# Patient Record
Sex: Female | Born: 1990 | Race: Black or African American | Hispanic: No | Marital: Single | Smoking: Current some day smoker
Health system: Southern US, Community
[De-identification: ages and names within clinical notes are randomized; demographics above are authoritative.]

## PROBLEM LIST (undated history)

## (undated) DIAGNOSIS — D649 Anemia, unspecified: Secondary | ICD-10-CM

## (undated) DIAGNOSIS — I456 Pre-excitation syndrome: Secondary | ICD-10-CM

## (undated) DIAGNOSIS — N289 Disorder of kidney and ureter, unspecified: Secondary | ICD-10-CM

## (undated) DIAGNOSIS — F1911 Other psychoactive substance abuse, in remission: Secondary | ICD-10-CM

## (undated) DIAGNOSIS — G40909 Epilepsy, unspecified, not intractable, without status epilepticus: Secondary | ICD-10-CM

## (undated) DIAGNOSIS — Z62810 Personal history of physical and sexual abuse in childhood: Secondary | ICD-10-CM

## (undated) HISTORY — PX: KIDNEY STONE SURGERY: SHX686

## (undated) HISTORY — DX: Anemia, unspecified: D64.9

## (undated) HISTORY — PX: APPENDECTOMY: SHX54

---

## 2016-06-21 DIAGNOSIS — I82409 Acute embolism and thrombosis of unspecified deep veins of unspecified lower extremity: Secondary | ICD-10-CM

## 2016-06-21 HISTORY — DX: Acute embolism and thrombosis of unspecified deep veins of unspecified lower extremity: I82.409

## 2018-06-21 DIAGNOSIS — N200 Calculus of kidney: Secondary | ICD-10-CM

## 2018-06-21 HISTORY — DX: Calculus of kidney: N20.0

## 2019-04-21 ENCOUNTER — Encounter (HOSPITAL_COMMUNITY): Payer: Self-pay | Admitting: Emergency Medicine

## 2019-04-21 ENCOUNTER — Other Ambulatory Visit: Payer: Self-pay

## 2019-04-21 ENCOUNTER — Emergency Department (HOSPITAL_COMMUNITY)
Admission: EM | Admit: 2019-04-21 | Discharge: 2019-04-21 | Disposition: A | Discharge status: Home / Self Care | Payer: Medicaid - Out of State | Attending: Emergency Medicine | Admitting: Emergency Medicine

## 2019-04-21 DIAGNOSIS — F17210 Nicotine dependence, cigarettes, uncomplicated: Secondary | ICD-10-CM | POA: Diagnosis not present

## 2019-04-21 DIAGNOSIS — M62838 Other muscle spasm: Secondary | ICD-10-CM | POA: Insufficient documentation

## 2019-04-21 DIAGNOSIS — M25512 Pain in left shoulder: Secondary | ICD-10-CM | POA: Diagnosis present

## 2019-04-21 HISTORY — DX: Pre-excitation syndrome: I45.6

## 2019-04-21 HISTORY — DX: Epilepsy, unspecified, not intractable, without status epilepticus: G40.909

## 2019-04-21 MED ORDER — METHOCARBAMOL 500 MG PO TABS: 500.0000 mg | ORAL_TABLET | Freq: Two times a day (BID) | ORAL | 0 refills | Status: DC | PRN

## 2019-04-21 MED ORDER — KETOROLAC TROMETHAMINE 60 MG/2ML IM SOLN
60.0000 mg | Freq: Once | INTRAMUSCULAR | Status: AC
  Administered 2019-04-21: 11:00:00 60 mg via INTRAMUSCULAR
  Filled 2019-04-21: qty 2

## 2019-04-21 MED ORDER — PREDNISONE 20 MG PO TABS: 40.0000 mg | ORAL_TABLET | Freq: Every day | ORAL | 0 refills | Status: DC

## 2019-04-21 MED ORDER — MELOXICAM 7.5 MG PO TABS: 7.5000 mg | ORAL_TABLET | Freq: Two times a day (BID) | ORAL | 0 refills | Status: AC | PRN

## 2019-04-21 MED ORDER — DEXAMETHASONE SODIUM PHOSPHATE 10 MG/ML IJ SOLN
10.0000 mg | Freq: Once | INTRAMUSCULAR | Status: AC
  Administered 2019-04-21: 11:00:00 10 mg via INTRAMUSCULAR
  Filled 2019-04-21: qty 1

## 2019-04-21 NOTE — ED Provider Notes (Signed)
[facility] EMERGENCY DEPARTMENT Provider Note   CSN: [id number] Arrival date & time: 04/21/19  9767     History   Chief Complaint Chief Complaint  Patient presents with  . Shoulder Pain  . Arm Pain    HPI Gina Alvarado is a 28 y.o. female.     HPI  This patient is a 28 year old female, known history of epilepsy and Wolff-Parkinson-White syndrome, she has a history of several years worth of pain that seems to come and go around the left shoulder.  This started 3 days ago, she was normal 4 days ago but 3 days ago started to have pain in the left shoulder, this is more in the trapezius, there is swelling associated with it, she is unable to move the left shoulder though she has no trouble moving her arm below the elbow.  No numbness, no weakness, no injuries.  This is the same thing that happens intermittently over the last several years.  She reports having imaging of the same injury in the past but has not had any focal answers.  Past Medical History:  Diagnosis Date  . Epilepsy ([place])   . WPW (Wolff-Parkinson-White syndrome)     There are no active problems to display for this patient.   Past Surgical History:  Procedure Laterality Date  . APPENDECTOMY    . CESAREAN SECTION    . KIDNEY STONE SURGERY       OB History   No obstetric history on file.      Home Medications    Prior to Admission medications   Medication Sig Start Date End Date Taking? Authorizing Provider  meloxicam (MOBIC) 7.5 MG tablet Take 1 tablet (7.5 mg total) by mouth 2 (two) times daily as needed for up to 14 days for pain. 04/21/19 05/05/19  Noemi Chapel, MD  methocarbamol (ROBAXIN) 500 MG tablet Take 1 tablet (500 mg total) by mouth 2 (two) times daily as needed for muscle spasms. 04/21/19   Noemi Chapel, MD  predniSONE (DELTASONE) 20 MG tablet Take 2 tablets (40 mg total) by mouth daily. 04/21/19   Noemi Chapel, MD    Family History No family history on file.  Social History Social History   Tobacco Use  . Smoking status: Current Every Day Smoker  . Smokeless tobacco: Never Used  Substance Use Topics  . Alcohol use: Yes  . Drug use: Never     Allergies   Patient has no allergy information on record.   Review of Systems Review of Systems  Musculoskeletal: Positive for myalgias, neck pain and neck stiffness. Negative for gait problem and joint swelling.  Neurological: Negative for weakness and numbness.  Hematological: Negative for adenopathy.     Physical Exam Updated Vital Signs BP (!) 141/104 (BP Location: Right Arm)   Pulse (!) 106   Temp 98.5 F (36.9 C) (Oral)   Resp 18   LMP 04/07/2019   SpO2 95%   Physical Exam Vitals signs and nursing note reviewed.  Constitutional:      Appearance: She is well-developed. She is not diaphoretic.  HENT:     Head: Normocephalic and atraumatic.  Eyes:     General:        Right eye: No discharge.        Left eye: No discharge.     Conjunctiva/sclera: Conjunctivae normal.  Pulmonary:     Effort: Pulmonary effort is normal. No respiratory distress.  Musculoskeletal:        General:  Swelling, tenderness and deformity present.     Comments: The patient has obvious swelling of the trapezius on the left, there is tenderness in this muscle, tenderness with range of motion of the neck and range of motion of the shoulder, no difficulty with ranging the elbow wrist or hand, no numbness or weakness of the arm  Skin:    General: Skin is warm and dry.     Findings: No erythema or rash.  Neurological:     Mental Status: She is alert.     Coordination: Coordination normal.     Comments: As above, no numbness or weakness      ED Treatments / Results  Labs (all labs ordered are listed, but only abnormal results are displayed) Labs Reviewed - No data to display  EKG None  Radiology No results found.  Procedures Procedures (including critical care time)  Medications Ordered in ED  Medications  ketorolac (TORADOL) injection 60 mg (has no administration in time range)  dexamethasone (DECADRON) injection 10 mg (has no administration in time range)     Initial Impression / Assessment and Plan / ED Course  I have reviewed the triage vital signs and the nursing notes.  Pertinent labs & imaging results that were available during my care of the patient were reviewed by me and considered in my medical decision making (see chart for details).        Imaging likely to be unhelpful as this patient has clear muscle spasm of the trapezius muscle.  She will be given Robaxin, anti-inflammatories and steroids, she can follow-up with the orthopedist outpatient, the patient is agreeable to the plan.  Final Clinical Impressions(s) / ED Diagnoses   Final diagnoses:  Muscle spasm of left shoulder area    ED Discharge Orders         Ordered    meloxicam (MOBIC) 7.5 MG tablet  2 times daily PRN     04/21/19 1036    predniSONE (DELTASONE) 20 MG tablet  Daily     04/21/19 1036    methocarbamol (ROBAXIN) 500 MG tablet  2 times daily PRN     04/21/19 1036           Eber Hong, MD 04/21/19 1040

## 2019-04-21 NOTE — Discharge Instructions (Addendum)
Please take the following medications exactly as prescribed  Mobic, 1 tablet twice a day as needed Prednisone, 40 mg daily for 5 days Robaxin, 500 mg twice daily as needed  Call the orthopedist clinic for follow-up, you have significant and severe muscle spasm, you would likely benefit from some element of ongoing physical therapy, warm compresses.

## 2019-04-21 NOTE — ED Triage Notes (Signed)
C/o L shoulder pain that radiates to L elbow x 3 days.  Reports history of same since 2016 with negative x-rays.  No known injury.

## 2019-07-30 ENCOUNTER — Other Ambulatory Visit: Payer: Self-pay

## 2019-07-30 ENCOUNTER — Emergency Department (HOSPITAL_COMMUNITY)
Admission: EM | Admit: 2019-07-30 | Discharge: 2019-07-30 | Disposition: A | Discharge status: Home / Self Care | Payer: Medicaid - Out of State | Attending: Emergency Medicine | Admitting: Emergency Medicine

## 2019-07-30 ENCOUNTER — Encounter (HOSPITAL_COMMUNITY): Payer: Self-pay

## 2019-07-30 DIAGNOSIS — H9201 Otalgia, right ear: Secondary | ICD-10-CM | POA: Insufficient documentation

## 2019-07-30 DIAGNOSIS — R509 Fever, unspecified: Secondary | ICD-10-CM | POA: Insufficient documentation

## 2019-07-30 DIAGNOSIS — J029 Acute pharyngitis, unspecified: Secondary | ICD-10-CM | POA: Insufficient documentation

## 2019-07-30 DIAGNOSIS — Z5321 Procedure and treatment not carried out due to patient leaving prior to being seen by health care provider: Secondary | ICD-10-CM | POA: Insufficient documentation

## 2019-07-30 HISTORY — DX: Disorder of kidney and ureter, unspecified: N28.9

## 2019-07-30 NOTE — ED Triage Notes (Signed)
Patient c/o right ear pain, sore throat, and fever x 3 days.

## 2019-08-01 ENCOUNTER — Emergency Department (HOSPITAL_COMMUNITY)
Admission: EM | Admit: 2019-08-01 | Discharge: 2019-08-01 | Disposition: A | Discharge status: Home / Self Care | Payer: Medicaid - Out of State | Attending: Emergency Medicine | Admitting: Emergency Medicine

## 2019-08-01 ENCOUNTER — Other Ambulatory Visit: Payer: Self-pay

## 2019-08-01 ENCOUNTER — Encounter (HOSPITAL_COMMUNITY): Payer: Self-pay | Admitting: *Deleted

## 2019-08-01 DIAGNOSIS — Z87891 Personal history of nicotine dependence: Secondary | ICD-10-CM | POA: Insufficient documentation

## 2019-08-01 DIAGNOSIS — J069 Acute upper respiratory infection, unspecified: Secondary | ICD-10-CM | POA: Insufficient documentation

## 2019-08-01 DIAGNOSIS — J029 Acute pharyngitis, unspecified: Secondary | ICD-10-CM | POA: Diagnosis present

## 2019-08-01 DIAGNOSIS — H9201 Otalgia, right ear: Secondary | ICD-10-CM | POA: Insufficient documentation

## 2019-08-01 DIAGNOSIS — Z20822 Contact with and (suspected) exposure to covid-19: Secondary | ICD-10-CM | POA: Diagnosis not present

## 2019-08-01 LAB — GROUP A STREP BY PCR: Group A Strep by PCR: NOT DETECTED

## 2019-08-01 LAB — SARS CORONAVIRUS 2 (TAT 6-24 HRS): SARS Coronavirus 2: NEGATIVE

## 2019-08-01 MED ORDER — CHLORHEXIDINE GLUCONATE 0.12 % MT SOLN: 15.0000 mL | Freq: Two times a day (BID) | OROMUCOSAL | 0 refills | Status: DC

## 2019-08-01 MED ORDER — ACETAMINOPHEN 500 MG PO TABS: 500.0000 mg | ORAL_TABLET | Freq: Four times a day (QID) | ORAL | 0 refills | Status: AC | PRN

## 2019-08-01 NOTE — ED Provider Notes (Signed)
[facility]-EMERGENCY DEPT Provider Note   CSN: [id number] Arrival date & time: 08/01/19  3474     History Chief Complaint  Patient presents with  . Sore Throat  . Otalgia    Rt    Gina Alvarado is a 29 y.o. female.  The history is provided by the patient. No language interpreter was used.  Sore Throat  Otalgia    29 year old female G4 P2 who is [redacted] weeks pregnant presenting with cold symptoms.  Patient report for the past 5 days she has had ., sore throat affecting the right side of her throat, mild congestion.  She denies any fever chills no loss of taste or smell no chest pain shortness of breath productive cough no significant neck pain.  She report history of recurrent strep infection in the past and this feels similar.  She denies any recent sick contact.  She is taking over-the-counter including Tylenol and ibuprofen with some relief.  No nausea vomiting or diarrhea. No recent sick contact with anyone with COVID-19.    Past Medical History:  Diagnosis Date  . Epilepsy ([facility])   . Renal disorder   . WPW (Wolff-Parkinson-White syndrome)     There are no problems to display for this patient.   Past Surgical History:  Procedure Laterality Date  . APPENDECTOMY    . CESAREAN SECTION    . KIDNEY STONE SURGERY       OB History    Gravida  1   Para      Term      Preterm      AB      Living        SAB      TAB      Ectopic      Multiple      Live Births              Family History  Problem Relation Age of Onset  . Diabetes Mother   . Hypertension Mother   . Asthma Mother   . Sarcoidosis Father     Social History   Tobacco Use  . Smoking status: Former Games developer  . Smokeless tobacco: Never Used  Substance Use Topics  . Alcohol use: Not Currently  . Drug use: Never    Home Medications Prior to Admission medications   Medication Sig Start Date End Date Taking? Authorizing Provider  methocarbamol (ROBAXIN) 500  MG tablet Take 1 tablet (500 mg total) by mouth 2 (two) times daily as needed for muscle spasms. 04/21/19   Eber Hong, MD  predniSONE (DELTASONE) 20 MG tablet Take 2 tablets (40 mg total) by mouth daily. 04/21/19   Eber Hong, MD    Allergies    Other  Review of Systems   Review of Systems  HENT: Positive for ear pain.   All other systems reviewed and are negative.   Physical Exam Updated Vital Signs BP (!) 142/95 (BP Location: Left Arm)   Pulse 99   Temp 98.1 F (36.7 C) (Oral)   Resp 17   Wt 100.2 kg   LMP 06/16/2019   SpO2 97%   BMI 43.12 kg/m   Physical Exam Vitals and nursing note reviewed.  Constitutional:      General: She is not in acute distress.    Appearance: She is well-developed.  HENT:     Head: Atraumatic.     Comments: TMs erythematous bilat.  R tragus tenderness.  bilateraly hypertrophy tonsils.  Some erythema noted to right tonsillar pillar.  Uvula midline.  No trismus.    Right Ear: Tympanic membrane is erythematous.     Left Ear: Tympanic membrane is erythematous.     Mouth/Throat:     Pharynx: Uvula midline. Posterior oropharyngeal erythema present.     Tonsils: 2+ on the right. 2+ on the left.  Eyes:     Conjunctiva/sclera: Conjunctivae normal.  Cardiovascular:     Rate and Rhythm: Normal rate and regular rhythm.     Heart sounds: Normal heart sounds.  Pulmonary:     Effort: Pulmonary effort is normal.     Breath sounds: Normal breath sounds. No wheezing, rhonchi or rales.  Abdominal:     Palpations: Abdomen is soft.  Musculoskeletal:     Cervical back: Neck supple.  Skin:    Findings: No rash.  Neurological:     Mental Status: She is alert and oriented to person, place, and time.  Psychiatric:        Mood and Affect: Mood normal.     ED Results / Procedures / Treatments   Labs (all labs ordered are listed, but only abnormal results are displayed) Labs Reviewed  GROUP A STREP BY PCR  SARS CORONAVIRUS 2 (TAT 6-24 HRS)     EKG None  Radiology No results found.  Procedures Procedures (including critical care time)  Medications Ordered in ED Medications - No data to display  ED Course  I have reviewed the triage vital signs and the nursing notes.  Pertinent labs & imaging results that were available during my care of the patient were reviewed by me and considered in my medical decision making (see chart for details).    MDM Rules/Calculators/A&P                      BP (!) 142/95 (BP Location: Left Arm)   Pulse 99   Temp 98.1 F (36.7 C) (Oral)   Resp 17   Wt 100.2 kg   LMP 06/16/2019   SpO2 97%   BMI 43.12 kg/m   Final Clinical Impression(s) / ED Diagnoses Final diagnoses:  Viral upper respiratory tract infection    Rx / DC Orders ED Discharge Orders         Ordered    acetaminophen (TYLENOL) 500 MG tablet  Every 6 hours PRN     08/01/19 1042    chlorhexidine (PERIDEX) 0.12 % solution  2 times daily     08/01/19 1042         9:03 AM Patient here with sore throat and URI symptoms.  No obvious signs to suggest peritonsillar abscess or deep tissue infection.  Patient is well-appearing.  Strep test ordered.  She is currently [redacted] weeks pregnant.  I recommend patient to avoid NSAIDs use during pregnancy.  10:40 AM Strep test negative, COVID-19 test currently pending.  Pt d/c home with tylenol and peridex for sxs control.  Return precaution given.  Recommend outpt f/u with OB for further management of her pregnancy.   Gina Alvarado was evaluated in Emergency Department on 08/01/2019 for the symptoms described in the history of present illness. She was evaluated in the context of the global COVID-19 pandemic, which necessitated consideration that the patient might be at risk for infection with the SARS-CoV-2 virus that causes COVID-19. Institutional protocols and algorithms that pertain to the evaluation of patients at risk for COVID-19 are in a state of rapid change based on  information released  by regulatory bodies including the CDC and federal and state organizations. These policies and algorithms were followed during the patient's care in the ED.    Gina Moras, PA-C 08/01/19 1044    Gina Dessert, MD 08/03/19 1704

## 2019-08-01 NOTE — ED Triage Notes (Signed)
C/o sore throat and rt ear pain, hoariness noted today.

## 2019-08-01 NOTE — Discharge Instructions (Signed)
Your symptoms are likely due to a viral upper respiratory infection. A covid-19 test have been obtained, please check the result in the next 24 hrs in your MyChart.  Avoid non-steroidal anti-inflammatory medication during pregnancy as it can be harmful.  Return if your symptoms worsen.

## 2019-09-25 ENCOUNTER — Other Ambulatory Visit: Payer: Self-pay

## 2019-09-25 ENCOUNTER — Encounter (HOSPITAL_COMMUNITY): Payer: Self-pay | Admitting: Emergency Medicine

## 2019-09-25 ENCOUNTER — Emergency Department (HOSPITAL_COMMUNITY): Payer: Medicaid - Out of State

## 2019-09-25 ENCOUNTER — Emergency Department (HOSPITAL_COMMUNITY)
Admission: EM | Admit: 2019-09-25 | Discharge: 2019-09-25 | Disposition: A | Discharge status: Home / Self Care | Payer: Medicaid - Out of State | Attending: Emergency Medicine | Admitting: Emergency Medicine

## 2019-09-25 DIAGNOSIS — O219 Vomiting of pregnancy, unspecified: Secondary | ICD-10-CM | POA: Insufficient documentation

## 2019-09-25 DIAGNOSIS — N3091 Cystitis, unspecified with hematuria: Secondary | ICD-10-CM | POA: Insufficient documentation

## 2019-09-25 DIAGNOSIS — O99891 Other specified diseases and conditions complicating pregnancy: Secondary | ICD-10-CM | POA: Diagnosis present

## 2019-09-25 DIAGNOSIS — Z3A14 14 weeks gestation of pregnancy: Secondary | ICD-10-CM | POA: Insufficient documentation

## 2019-09-25 DIAGNOSIS — N390 Urinary tract infection, site not specified: Secondary | ICD-10-CM

## 2019-09-25 DIAGNOSIS — O2342 Unspecified infection of urinary tract in pregnancy, second trimester: Secondary | ICD-10-CM | POA: Insufficient documentation

## 2019-09-25 DIAGNOSIS — Z3491 Encounter for supervision of normal pregnancy, unspecified, first trimester: Secondary | ICD-10-CM

## 2019-09-25 DIAGNOSIS — R109 Unspecified abdominal pain: Secondary | ICD-10-CM

## 2019-09-25 DIAGNOSIS — R112 Nausea with vomiting, unspecified: Secondary | ICD-10-CM

## 2019-09-25 LAB — COMPREHENSIVE METABOLIC PANEL
ALT: 14 U/L (ref 0–44)
AST: 20 U/L (ref 15–41)
Albumin: 3.8 g/dL (ref 3.5–5.0)
Alkaline Phosphatase: 51 U/L (ref 38–126)
Anion gap: 8 (ref 5–15)
BUN: 7 mg/dL (ref 6–20)
CO2: 24 mmol/L (ref 22–32)
Calcium: 9.3 mg/dL (ref 8.9–10.3)
Chloride: 107 mmol/L (ref 98–111)
Creatinine, Ser: 0.69 mg/dL (ref 0.44–1.00)
GFR calc Af Amer: >60 mL/min (ref >60)
GFR calc non Af Amer: >60 mL/min (ref >60)
Glucose, Bld: 93 mg/dL (ref 70–99)
Potassium: 4 mmol/L (ref 3.5–5.1)
Sodium: 139 mmol/L (ref 135–145)
Total Bilirubin: 0.4 mg/dL (ref 0.3–1.2)
Total Protein: 7.6 g/dL (ref 6.5–8.1)

## 2019-09-25 LAB — URINALYSIS, ROUTINE W REFLEX MICROSCOPIC
Bilirubin Urine: NEGATIVE
Glucose, UA: NEGATIVE mg/dL
Hgb urine dipstick: NEGATIVE
Ketones, ur: 20 mg/dL — AB
Nitrite: POSITIVE — AB
Protein, ur: 100 mg/dL — AB
Specific Gravity, Urine: 1.028 (ref 1.005–1.030)
pH: 7 (ref 5.0–8.0)

## 2019-09-25 LAB — CBC
HCT: 37.7 % (ref 36.0–46.0)
Hemoglobin: 12.3 g/dL (ref 12.0–15.0)
MCH: 30.8 pg (ref 26.0–34.0)
MCHC: 32.6 g/dL (ref 30.0–36.0)
MCV: 94.3 fL (ref 80.0–100.0)
Platelets: 342 10*3/uL (ref 150–400)
RBC: 4 MIL/uL (ref 3.87–5.11)
RDW: 13.4 % (ref 11.5–15.5)
WBC: 12.6 10*3/uL — ABNORMAL HIGH (ref 4.0–10.5)
nRBC: 0 % (ref 0.0–0.2)

## 2019-09-25 LAB — LIPASE, BLOOD: Lipase: 32 U/L (ref 11–51)

## 2019-09-25 LAB — I-STAT BETA HCG BLOOD, ED (MC, WL, AP ONLY): I-stat hCG, quantitative: >2000 m[IU]/mL — ABNORMAL HIGH (ref <5)

## 2019-09-25 MED ORDER — SODIUM CHLORIDE 0.9 % IV SOLN
2.0000 g | Freq: Once | INTRAVENOUS | Status: AC
  Administered 2019-09-25: 2 g via INTRAVENOUS
  Filled 2019-09-25: qty 20

## 2019-09-25 MED ORDER — METOCLOPRAMIDE HCL 10 MG PO TABS: 10.0000 mg | ORAL_TABLET | Freq: Three times a day (TID) | ORAL | 0 refills | Status: DC | PRN

## 2019-09-25 MED ORDER — SODIUM CHLORIDE 0.9 % IV BOLUS
1000.0000 mL | Freq: Once | INTRAVENOUS | Status: AC
  Administered 2019-09-25: 1000 mL via INTRAVENOUS

## 2019-09-25 MED ORDER — METOCLOPRAMIDE HCL 5 MG/ML IJ SOLN
10.0000 mg | Freq: Once | INTRAMUSCULAR | Status: AC
  Administered 2019-09-25: 10 mg via INTRAVENOUS
  Filled 2019-09-25: qty 2

## 2019-09-25 MED ORDER — CEFPODOXIME PROXETIL 200 MG PO TABS: 200.0000 mg | ORAL_TABLET | Freq: Two times a day (BID) | ORAL | 0 refills | Status: DC

## 2019-09-25 MED ORDER — DOXYLAMINE-PYRIDOXINE 10-10 MG PO TBEC: 1.0000 | DELAYED_RELEASE_TABLET | Freq: Every evening | ORAL | 0 refills | Status: DC | PRN

## 2019-09-25 NOTE — ED Notes (Signed)
Patient given water per request.

## 2019-09-25 NOTE — ED Provider Notes (Signed)
[facility]-EMERGENCY DEPT Provider Note   CSN: [id number] Arrival date & time: 09/25/19  1356     History Chief Complaint  Patient presents with  . Abdominal Pain    Gina Alvarado is a 29 y.o. female.  She is approximately [redacted] weeks pregnant but has not seen her OB yet.  [facility] see [facility]  She has had some nausea during pregnancy but she is complaining of increased nausea associated with vomiting along with right low back and right lower quadrant abdominal pain.  No fevers or chills no cough no chest pain.  No vaginal bleeding or discharge.  She is a G3, P1.  No urinary symptoms no hematuria.  The history is provided by the patient.  Abdominal Pain Pain location:  RLQ Pain quality: cramping   Pain radiates to:  Back Pain severity:  Moderate Onset quality:  Gradual Duration:  2 days Timing:  Constant Progression:  Unchanged Chronicity:  New Context: retching   Context: not trauma   Relieved by:  Nothing Worsened by:  Nothing Ineffective treatments:  None tried Associated symptoms: nausea and vomiting   Associated symptoms: no chest pain, no constipation, no cough, no diarrhea, no dysuria, no fever, no hematemesis, no hematochezia, no hematuria, no shortness of breath, no sore throat, no vaginal bleeding and no vaginal discharge   Risk factors: pregnancy        Past Medical History:  Diagnosis Date  . Epilepsy ([facility])   . Renal disorder   . WPW (Wolff-Parkinson-White syndrome)     There are no problems to display for this patient.   Past Surgical History:  Procedure Laterality Date  . APPENDECTOMY    . CESAREAN SECTION    . KIDNEY STONE SURGERY       OB History    Gravida  1   Para      Term      Preterm      AB      Living        SAB      TAB      Ectopic      Multiple      Live Births              Family History  Problem Relation Age of Onset  . Diabetes Mother   .  Hypertension Mother   . Asthma Mother   . Sarcoidosis Father     Social History   Tobacco Use  . Smoking status: Former Games developer  . Smokeless tobacco: Never Used  Substance Use Topics  . Alcohol use: Not Currently  . Drug use: Never    Home Medications Prior to Admission medications   Medication Sig Start Date End Date Taking? Authorizing Provider  acetaminophen (TYLENOL) 500 MG tablet Take 1 tablet (500 mg total) by mouth every 6 (six) hours as needed. 08/01/19   Fayrene Helper, PA-C  chlorhexidine (PERIDEX) 0.12 % solution Use as directed 15 mLs in the mouth or throat 2 (two) times daily. 08/01/19   Fayrene Helper, PA-C  methocarbamol (ROBAXIN) 500 MG tablet Take 1 tablet (500 mg total) by mouth 2 (two) times daily as needed for muscle spasms. 04/21/19   Eber Hong, MD  predniSONE (DELTASONE) 20 MG tablet Take 2 tablets (40 mg total) by mouth daily. 04/21/19   Eber Hong, MD    Allergies    Other  Review of Systems   Review of Systems  Constitutional: Negative  for fever.  HENT: Negative for sore throat.   Eyes: Negative for visual disturbance.  Respiratory: Negative for cough and shortness of breath.   Cardiovascular: Negative for chest pain.  Gastrointestinal: Positive for abdominal pain, nausea and vomiting. Negative for constipation, diarrhea, hematemesis and hematochezia.  Genitourinary: Negative for dysuria, hematuria, vaginal bleeding and vaginal discharge.  Musculoskeletal: Positive for back pain.  Skin: Negative for rash.  Neurological: Negative for headaches.    Physical Exam Updated Vital Signs BP 138/87 (BP Location: Right Arm)   Pulse 95   Temp 99.4 F (37.4 C) (Oral)   Resp 16   LMP 06/16/2019   SpO2 97%   Physical Exam Vitals and nursing note reviewed.  Constitutional:      General: She is not in acute distress.    Appearance: She is well-developed.  HENT:     Head: Normocephalic and atraumatic.  Eyes:     Conjunctiva/sclera: Conjunctivae normal.   Cardiovascular:     Rate and Rhythm: Normal rate and regular rhythm.     Heart sounds: No murmur.  Pulmonary:     Effort: Pulmonary effort is normal. No respiratory distress.     Breath sounds: Normal breath sounds.  Abdominal:     Palpations: Abdomen is soft.     Tenderness: There is abdominal tenderness in the right lower quadrant. There is no guarding or rebound.  Musculoskeletal:        General: Normal range of motion.     Cervical back: Neck supple.     Right lower leg: No edema.     Left lower leg: No edema.  Skin:    General: Skin is warm and dry.     Capillary Refill: Capillary refill takes less than 2 seconds.  Neurological:     General: No focal deficit present.     Mental Status: She is alert.     Gait: Gait normal.     ED Results / Procedures / Treatments   Labs (all labs ordered are listed, but only abnormal results are displayed) Labs Reviewed  CBC - Abnormal; Notable for the following components:      Result Value   WBC 12.6 (*)    All other components within normal limits  URINALYSIS, ROUTINE W REFLEX MICROSCOPIC - Abnormal; Notable for the following components:   Color, Urine AMBER (*)    APPearance CLOUDY (*)    Ketones, ur 20 (*)    Protein, ur 100 (*)    Nitrite POSITIVE (*)    Leukocytes,Ua SMALL (*)    Bacteria, UA MANY (*)    All other components within normal limits  I-STAT BETA HCG BLOOD, ED ([facility], [facility], [facility] ONLY) - Abnormal; Notable for the following components:   I-stat hCG, quantitative >2,000.0 (*)    All other components within normal limits  URINE CULTURE  LIPASE, BLOOD  COMPREHENSIVE METABOLIC PANEL    EKG None  Radiology US OB Limited  Result Date: 09/25/2019 CLINICAL DATA:  Abdominal pain EXAM: LIMITED OBSTETRIC ULTRASOUND FINDINGS: Number of Fetuses: 1 Heart Rate:  170 bpm Movement: Visualized Presentation: Transverse Placental Location: Anterior Previa: No Amniotic Fluid (Subjective):  Within normal limits. AFI:  cm BPD: 2.6 cm 14  w  3 d MATERNAL FINDINGS: Cervix:  Appears closed. Uterus/Adnexae: No abnormality visualized. IMPRESSION: Fourteen week 3 day pregnancy. Fetal heart rate 170 beats per minute. No acute maternal findings. This exam is performed on an emergent basis and does not comprehensively evaluate fetal size, dating, or anatomy;  follow-up complete OB US should be considered if further fetal assessment is warranted. Electronically Signed   By: Charlett Nose M.D.   On: 09/25/2019 19:07    Procedures Procedures (including critical care time)  Medications Ordered in ED Medications  sodium chloride 0.9 % bolus 1,000 mL (0 mLs Intravenous Stopped 09/25/19 2011)  metoCLOPramide (REGLAN) injection 10 mg (10 mg Intravenous Given 09/25/19 1746)  cefTRIAXone (ROCEPHIN) 2 g in sodium chloride 0.9 % 100 mL IVPB (0 g Intravenous Stopped 09/25/19 2045)    ED Course  I have reviewed the triage vital signs and the nursing notes.  Pertinent labs & imaging results that were available during my care of the patient were reviewed by me and considered in my medical decision making (see chart for details).  Clinical Course as of Sep 25 1445  Tue Sep 25, 2019  6314 Patient's ultrasound showing an IUP with fetal heart rate 170.  Interpreted by me.  Awaiting final reports.  Patient herself is feeling much better and is given a urine sample.  Waiting on results of urine.  Likely discharge with OB follow-up.   [MB]  2003 Discussed with Dr[facility].  He is recommending giving the patient a dose of IV ceftriaxone and sending her out on Cefpodoxime if she has insurance ordered Keflex and have her follow-up in the clinic.   [MB]    Clinical Course User Index [MB] Terrilee Files, MD   MDM Rules/Calculators/A&P                     This patient complains of abdominal pain nausea vomiting in the setting of pregnancy; this involves an extensive number of treatment Options and is a complaint that carries  with it a high risk of complications and Morbidity. The differential includes pregnancy issue, pyelonephritis, hyperemesis, intra-abdominal pathology.  She is already had an appendix removed so not likely related to that.  I ordered, reviewed and interpreted labs, which included white blood cell count 12.6 elevated.  Normal chemistries normal LFTs.  Urinalysis with 21-50 reds 6-10 whites many bacteria dirty sample 21-50 squames.  Is nitrite positive. I ordered medication IV fluids Reglan and ceftriaxone for treatment of presumptive urinary tract infection. I ordered imaging studies which included ultrasound pelvis and I independently    visualized and interpreted imaging which showed intrauterine pregnancy with fetal heart rate of 170s Previous records obtained and reviewed in epic I consulted Dr. Damaris Schooner and discussed lab and imaging findings.  He recommended starting the patient on IV and then oral antibiotics and have her follow-up with the clinic  After the interventions stated above, I reevaluated the patient and found improvement in her pain and nausea.  This is possibly reflective of a kidney stone lower pain does not seem colicky.  Possibly just from cystitis.  Reassuring fetal ultrasound and improvement in her symptoms and so I think outpatient follow-up is appropriate.  We discussed return instructions.  All questions answered to the best my ability.   Final Clinical Impression(s) / ED Diagnoses Final diagnoses:  First trimester pregnancy  Urinary tract infection with hematuria, site unspecified  Non-intractable vomiting with nausea, unspecified vomiting type    Rx / DC Orders ED Discharge Orders         Ordered    metoCLOPramide (REGLAN) 10 MG tablet  Every 8 hours PRN     09/25/19 2045    Doxylamine-Pyridoxine 10-10 MG TBEC  At  bedtime PRN     09/25/19 2045    cefpodoxime (VANTIN) 200 MG tablet  2 times daily     09/25/19 2045           Terrilee Files, MD 09/26/19  1450

## 2019-09-25 NOTE — Discharge Instructions (Addendum)
You were seen in the emergency department for low back pain nausea vomiting and low abdominal pain.  You had an ultrasound that showed a 12-week and 2-day pregnancy.  Your urine looks like possible infection so we gave you some IV antibiotics and are giving you some oral antibiotics and some nausea medication.  Please call your OB clinic tomorrow to arrange close follow-up.  Return to the emergency department if any worsening symptoms.

## 2019-09-25 NOTE — ED Notes (Signed)
US at bedside

## 2019-09-25 NOTE — ED Triage Notes (Signed)
Per pt, states she started having abdominal cramping and vomiting last night-states she is [redacted] weeks pregnant-has not seen GYN MD

## 2019-09-28 LAB — URINE CULTURE: Culture: >=100000 — AB

## 2019-09-29 ENCOUNTER — Telehealth: Payer: Self-pay | Admitting: Emergency Medicine

## 2019-09-29 NOTE — Telephone Encounter (Signed)
Post ED Visit - Positive Culture Follow-up  Culture report reviewed by antimicrobial stewardship pharmacist: [facility] []  , Pharm.D. []  Enzo Bi, Pharm.D., BCPS AQ-ID []  , Pharm.D., BCPS []  Celedonio Miyamoto, Pharm.D., BCPS []  Parker, Garvin Fila.D., BCPS, AAHIVP []  , Pharm.D., BCPS, AAHIVP []  Georgina Pillion, PharmD, BCPS []  , PharmD, BCPS []  [place], PharmD, BCPS []  [address], PharmD []  , PharmD, BCPS []  Estella Husk, PharmD  Pharmacy Team []  Lysle Pearl, PharmD []  , PharmD []  Phillips Climes, PharmD []  , Rph []  [employer]) , PharmD []  Verlan Friends, PharmD [x]  , PharmD []  Mervyn Gay, PharmD []  , PharmD []  Vinnie Level, PharmD []  [facility], PharmD []  , PharmD []  Len Childs, PharmD   Positive urine culture Treated with Cefpodoxime, organism sensitive to the same and no further patient follow-up is required at this time.  Larayah Clute 09/29/2019, 1:42 PM

## 2019-10-08 ENCOUNTER — Ambulatory Visit (INDEPENDENT_AMBULATORY_CARE_PROVIDER_SITE_OTHER): Payer: Medicaid - Out of State | Admitting: *Deleted

## 2019-10-08 ENCOUNTER — Other Ambulatory Visit: Payer: Self-pay

## 2019-10-08 DIAGNOSIS — G40909 Epilepsy, unspecified, not intractable, without status epilepticus: Secondary | ICD-10-CM | POA: Insufficient documentation

## 2019-10-08 DIAGNOSIS — N390 Urinary tract infection, site not specified: Secondary | ICD-10-CM

## 2019-10-08 DIAGNOSIS — R319 Hematuria, unspecified: Secondary | ICD-10-CM

## 2019-10-08 DIAGNOSIS — O99210 Obesity complicating pregnancy, unspecified trimester: Secondary | ICD-10-CM

## 2019-10-08 DIAGNOSIS — I456 Pre-excitation syndrome: Secondary | ICD-10-CM

## 2019-10-08 DIAGNOSIS — O0990 Supervision of high risk pregnancy, unspecified, unspecified trimester: Secondary | ICD-10-CM

## 2019-10-08 DIAGNOSIS — Z86718 Personal history of other venous thrombosis and embolism: Secondary | ICD-10-CM | POA: Insufficient documentation

## 2019-10-08 HISTORY — DX: Urinary tract infection, site not specified: N39.0

## 2019-10-08 NOTE — Progress Notes (Signed)
I connected with  Gina Alvarado on 10/08/19 at  2:30 PM EDT by telephone and verified that I am speaking with the correct person using two identifiers.   I discussed the limitations, risks, security and privacy concerns of performing an evaluation and management service by telephone and the availability of in person appointments. I also discussed with the patient that there may be a patient responsible charge related to this service. The patient expressed understanding and agreed to proceed.  I explained I am completing her New OB Intake today. We discussed Her EDD and that it is based on  sure LMP . I reviewed her allergies, meds, OB History, Medical /Surgical history, and appropriate screenings. I informed her of [facility] services. She does have a  History of DVT. I disucssed with Dr.Stinson and informed Gina Alvarado we will discuss with her prescribing Lovenox for during her pregnancy due to her history of DVT but since we have not seen her yet - doctor will need to see her and discuss; then prescribe lovenox. I also asked if she has a cardiologist. She states she last saw in about 2018 and hasn't gotten once since she moved to [facility]. I explained we will probably send her to a cardiologist due to her history of Evelene Croon parkison white syndrome.  I explained I will send her the [employer] app and app was sent to her while on phone.  I explained we will send a blood pressure cuff to [facility] once her [place] Medicaid is approved. She states she has already applied for [place] Medicaid.  Explained  then we will have her take her blood pressure weekly and enter into the app. I explained she will have some visits in office and some virtually. She already has MyChart and I assisted her with downloading the  app. I reviewed her new ob  appointment date/ time with her , our location and to wear mask, no visitors.  I explained she will have a pelvic exam, ob bloodwork, hemoglobin a1C, cbg ,pap, and  genetic testing if desired,-  she does want a panorama. I scheduled an [place] at 19 weeks and gave her the appointment. She voices understanding.   Gina Podgorski,RN  10/08/2019  2:27 PM

## 2019-10-08 NOTE — Progress Notes (Signed)
Patient ID: Gina Alvarado, female   DOB: 12/14/1990, 29 y.o.   MRN: [id number] Patient seen and assessed by nursing staff during this encounter. I have reviewed the chart and agree with the documentation and plan. I have also made any necessary editorial changes.  Scheryl Darter, MD 10/08/2019 3:28 PM

## 2019-10-08 NOTE — Patient Instructions (Signed)

## 2019-10-11 ENCOUNTER — Ambulatory Visit (INDEPENDENT_AMBULATORY_CARE_PROVIDER_SITE_OTHER): Payer: Medicaid - Out of State | Admitting: Obstetrics & Gynecology

## 2019-10-11 ENCOUNTER — Encounter: Payer: Self-pay | Admitting: Obstetrics & Gynecology

## 2019-10-11 ENCOUNTER — Other Ambulatory Visit (HOSPITAL_COMMUNITY)
Admission: RE | Admit: 2019-10-11 | Discharge: 2019-10-11 | Disposition: A | Discharge status: Home / Self Care | Payer: Medicaid - Out of State | Source: Ambulatory Visit | Attending: Obstetrics and Gynecology | Admitting: Obstetrics and Gynecology

## 2019-10-11 ENCOUNTER — Other Ambulatory Visit: Payer: Self-pay

## 2019-10-11 VITALS — BP 119/82 | HR 97 | Wt 222.1 lb

## 2019-10-11 DIAGNOSIS — O0990 Supervision of high risk pregnancy, unspecified, unspecified trimester: Secondary | ICD-10-CM | POA: Insufficient documentation

## 2019-10-11 DIAGNOSIS — I456 Pre-excitation syndrome: Secondary | ICD-10-CM

## 2019-10-11 DIAGNOSIS — G40909 Epilepsy, unspecified, not intractable, without status epilepticus: Secondary | ICD-10-CM

## 2019-10-11 DIAGNOSIS — Z86718 Personal history of other venous thrombosis and embolism: Secondary | ICD-10-CM

## 2019-10-11 MED ORDER — ENOXAPARIN SODIUM 40 MG/0.4ML ~~LOC~~ SOLN: 40.0000 mg | SUBCUTANEOUS | 6 refills | Status: AC

## 2019-10-11 NOTE — Progress Notes (Signed)
  Subjective:h/o DVT    Gina Alvarado is a G3P1011 [redacted]w[redacted]d being seen today for her first obstetrical visit.  Her obstetrical history is significant for H/O DVT, CS, seizure, WPW. Patient does intend to breast feed. Pregnancy history fully reviewed.  Patient reports nausea.  Vitals:   10/11/19 1528  BP: 119/82  Pulse: 97  Weight: 222 lb 1.6 oz (100.7 kg)    HISTORY: OB History  Gravida Para Term Preterm AB Living  3 1 1   1 1   SAB TAB Ectopic Multiple Live Births          1    # Outcome Date GA Lbr Len/2nd Weight Sex Delivery Anes PTL Lv  3 Current           2 AB 2019             Birth Comments: home [place], went for abortion and then had bleeding and went to ED and had to have D&C  1 Term 08/21/12 [redacted]w[redacted]d  6 lb 15 oz (3.147 kg) F CS-Unspec EPI  LIV     Birth Comments: c/s due FTP    Past Medical History:  Diagnosis Date  . Anemia    after 1st baby  . DVT of lower extremity (deep venous thrombosis) (HCC) 2018   left  . Epilepsy (HCC)   . Kidney stones 2020  . Renal disorder   . WPW (Wolff-Parkinson-White syndrome)    Past Surgical History:  Procedure Laterality Date  . APPENDECTOMY    . CESAREAN SECTION    . KIDNEY STONE SURGERY     Family History  Problem Relation Age of Onset  . Diabetes Mother   . Hypertension Mother   . Asthma Mother   . Sarcoidosis Father      Exam    Uterus:     Pelvic Exam:    Perineum: No Hemorrhoids   Vulva: normal   Vagina:  normal mucosa   pH:     Cervix: no lesions   Adnexa: normal adnexa   Bony Pelvis: average  System: Breast:  normal appearance, no masses or tenderness   Skin: normal coloration and turgor, no rashes    Neurologic: oriented, normal mood   Extremities: normal strength, tone, and muscle mass   HEENT PERRLA   Mouth/Teeth dental hygiene good   Neck supple   Cardiovascular: regular rate and rhythm, no murmurs or gallops   Respiratory:  appears well, vitals normal, no respiratory distress, acyanotic,  normal RR, neck free of mass or lymphadenopathy, chest clear, no wheezing, crepitations, rhonchi, normal symmetric air entry   Abdomen: soft, non-tender; bowel sounds normal; no masses,  no organomegaly   Urinary: urethral meatus normal      Assessment:    Pregnancy: 2021 Patient Active Problem List   Diagnosis Date Noted  . UTI (urinary tract infection) 10/08/2019  . Supervision of high risk pregnancy, antepartum 10/08/2019  . WPW (Wolff-Parkinson-White syndrome)   . Epilepsy (HCC)   . History of DVT (deep vein thrombosis)   . Obesity affecting pregnancy         Plan:     Initial labs drawn. Prenatal vitamins. Problem list reviewed and updated. Genetic Screening discussed ordered.  Ultrasound discussed; fetal survey: requested.  Follow up in 4 weeks. 50% of 30 min visit spent on counseling and coordination of care.  Lovenox 40 mg [place] daily   10/10/2019 10/11/2019

## 2019-10-11 NOTE — Patient Instructions (Signed)
AREA PEDIATRIC/FAMILY PRACTICE PHYSICIANS  [place] ([address]) . [facility] o Chambliss, MD; Eniola, MD; Hale, MD; Hensel, MD; McDiarmid, MD; McIntyer, MD; Neal, MD; Walden, MD o [address] [place], [place] [address] o [phone] o Mon-Fri 8:30-12:30, 1:30-5:00 o Providers come to see babies at [facility] o Accepting Medicaid . [facility] o Limited providers who accept newborns: Koirala, MD; Morrow, MD; Wolters, MD o [address], [place], [place] [address] o [phone] o Mon-Fri 8:00-5:30 o Babies seen by providers at [facility] o Does NOT accept Medicaid o Please call early in hospitalization for appointment (limited availability)  . [facility] o Mulberry, MD o [address] [place], [place] [address] o [phone] o Mon, Tue, Thur, Fri 8:30-5:00, Wed 10:00-7:00 (closed 1-2pm) o Babies seen by [facility] providers o Accepting Medicaid . [facility] o Rubin, MD o [address], [place], [place] [address] o [phone] o Mon-Fri 8:30-5:00, Sat 8:30-12:00 o Provider comes to see babies at [facility] o Accepting Medicaid o Must have been referred from current patients or contacted office prior to delivery . [facility] ([facility]) o Brown, MD; Chandler, MD; Ettefagh, MD; Grant, MD; Lester, MD; McCormick, MD; McQueen, MD; Prose, MD; Simha, MD; Stanley, MD; Stryffeler, NP; Tebben, NP o [address], [place], [place] [address] o [phone] o Mon, Tue, Thur, Fri 8:30-5:30, Wed 9:30-5:30, Sat 8:30-12:30 o Babies seen by [facility] providers o Accepting Medicaid o Only accepting infants of first-time parents or siblings of current patients o Hospital discharge coordinator will make follow-up appointment . Jack Amos o [address],  [place], [place]  [address] o [phone]   Fax - [phone] . [facility] o [address], [place], [place]  [address] o Phone - [phone]   Fax - [phone] . Shilpa Gosrani o [address], [place], [place]  [address] o [phone]  [place] ([address]) . [facility] o Bates, MD; [place], MD; Cooper, Cox, MD; MD; Davis, MD; Dovico, MD; Ettefaugh, MD; Little, MD; Lowe, MD; Keiffer, MD; Melvin, MD; Sumner, MD; Williams, MD o [address], [place], [place] [address] o [phone] o Mon-Fri 8:30-5:00 (extended evenings Mon-Thur as needed), Sat-Sun 10:00-1:00 o Providers come to see babies at [facility] o Accepting Medicaid for families of first-time babies and families with all children in the household age 3 and under. Must register with office prior to making appointment (M-F only). . [facility] o Henson, NP; Knapp, MD; Lalonde, MD; Tysinger, PA o [address] [place], [place] [address] o [phone] o Mon-Fri 8:00-5:00 o Babies seen by providers at [facility] o Does NOT accept Medicaid/Commercial Insurance Only . [facility] ([facility])  o Artis, MD; Barnes, MD; Bratton, MD; Coccaro, MD; Lockett Gardner, MD; Kramer, MD; Marshall, MD; Netherton, MD; Poleto, MD; Skinner, MD o [address] [place], [place] [address] o [phone] o Mon-Fri 8:30-5:30, Sat (Oct.-Mar.) 9:00-1:00 o Babies seen by providers at [facility] o Accepting Medicaid  [place] ([address]) . [facility] o Reid, MD; Warner, MD o [address], [place], [place] [address] o [phone] o Mon-Fri 8:30-5:00, Sat 8:30-12:00 o Providers come to see babies at [facility] o Does NOT accept Medicaid . [facility] o Becker, PA; Hagler, MD; Scifres, PA; Sun, MD; Swayne, MD o [address],  [place], [place] [address] o [phone] o Mon-Fri 8:00-5:00 o Babies seen by providers at [facility] o Does NOT accept Medicaid o Only accepting babies of parents who are patients o Please call early in hospitalization for appointment (limited availability) . [facility] o Clark, MD; Frye, MD; Kelleher, MD; Mack, NP; Miller, MD; O'Keller, MD; Patterson, NP; Pudlo, MD; Puzio, MD; Thomas, MD; Tucker, MD; Twiselton, MD o [address], [place], [place] [address] o [phone] o Mon-Fri 8:00-5:00, Sat 9:00-12:00 o Providers come to see babies at [facility] o Does NOT accept Medicaid  [place] ([address]) . [facility] o Limited providers accepting new patients: Brake, NP; [place], PA o [address], [place], [place] [address] o [phone] o Mon-Fri 8:00-5:00 o Babies seen by providers at [facility] o Does NOT accept Medicaid o Only accepting babies of parents who are patients o Please call early in hospitalization for appointment (limited availability) . [facility] o Gay, MD; Quinlan, MD o [address] [place], [place] [address] o [phone] (press 1 to schedule appointment) o Mon-Fri 8:00-5:00 o Providers come to see babies at [facility] o Does NOT accept Medicaid . [facility] o Mazer, MD o [address] [place], [place] [address] o [phone] o Mon-Fri 8:30-5:00 (lunch 12:30-1:00), extended hours by appointment only Wed 5:00-6:30 o Babies seen by [facility] providers o Accepting Medicaid . [facility] o Banks, MD; Jordan, MD; Koberlein, MD o [address], [place], [place] [address] o [phone] o Mon-Fri 8:00-5:00 o Babies seen by [facility] providers o Does NOT accept Medicaid . [facility] o Parker, MD; Hunter, MD; Wallace, DO o [address] [place], [place]  [address] o [phone] o Mon-Fri 8:00-5:00 o Babies seen by [facility] providers o Does NOT accept Medicaid . [facility] o Brandon, PA; Brecken, PA; Christy, NP; Dees, MD; DeClaire, MD; DeWeese, MD; Hansen, NP; Mills, NP; Parrish, NP; Smoot, NP; Summer, MD; Vapne, MD o [address] [place], [place] [address] o [phone] o Mon-Fri 8:30-5:00, Sat 10:00-1:00 o Providers come to see babies at [facility] o Does NOT accept Medicaid o Free prenatal information session Tuesdays at 4:45pm . [facility] o Bouska, MD; Gordon, PA; Jeffery, PA; Weber, PA o [address] [place] [address] o [phone] o Mon-Fri 7:30-5:30 o Babies seen by [facility] providers . [facility] o [address], [place], [place]  [address] o [phone]   Fax - [phone]  [place] ([address] & [address]) . [facility] o [place], [place] o [address] [place], [place] [address] o [phone] o Mon-Thur 8:00-6:00 o Providers come to see babies at [facility] o Accepting Medicaid . [facility] o Anderson, NP; Badger, MD; Beal, PA; Spencer, PA o [address] [place], [place] [address] o [phone] o Mon-Thur 7:30-7:30, Fri 7:30-4:30 o Babies seen by [facility] providers o Accepting Medicaid . [facility] o Agbuya, MD; Klett, NP; Romgoolam, MD o [address], [place],  [address] o [phone] o Mon-Fri 8:30-5:00, Sat 8:30-12:00 o Providers come to see babies at [facility] o Accepting Medicaid o Must have "Meet & Greet" appointment at office prior to delivery . [facility]  ([facility] ) o McCord,   MD; Wallace, MD; Wood, MD o [address], [place], [place] [address] o [phone] o Mon-Wed 8:00-6:00, Thur-Fri 8:00-5:00, Sat 9:00-12:00 o Providers come to  see babies at [facility] o Does NOT accept Medicaid o Only accepting siblings of current patients . [facility]  o [address], [place], [place]  [address] o [phone]   Fax - [phone] . [facility] o [address], [place], [place]  [address] o [phone]   Fax - [phone]  [place] ([address] & [address]) . [facility] o Cirigliano, DO; Matthews, DO o [address] [place], [place] [address] o [phone] o Mon-Fri 7:00-5:00 o Babies seen by [facility] providers o Does NOT accept Medicaid . [facility] o Briscoe, MD; Howley, PA; Moreira, PA o [address], [place], [place] [address] o [phone] o Mon-Fri 8:00-5:00 o Babies seen by [facility] providers o Accepting Medicaid . [facility] o Boyd, MD; Church, PA; Jones, NP; Osborn, PA o [address], [place], [place] [address] o [phone] o Mon-Fri 8:00-5:00 o Babies seen by providers at [facility] o Accepting Medicaid  [place] ([address]) . [facility] [place] o Wendling, DO o [address] [place], [place] [address] o [phone] o Mon-Fri 8:00-5:00 o Babies seen by [facility] providers o Does NOT accept Medicaid o Limited availability, please call early in hospitalization to schedule follow-up . [facility] o Calderon, PA; Cummings, MD; Dillard, MD; Martin, PA; Olson, MD; VanDeven, PA o [address], [place], [place] [address] o [phone] o Mon-Fri 8:30-5:00, Sat 9:00-12:00 o Babies seen by providers at [facility] o Accepting Medicaid o Please register online then schedule online or call office o [email or web address] . [facility] o Hunter, NP; Kumar, MD; Martin Rogers, PA o [address], [place], [place] [address] o [phone] o Mon-Fri 8:00-5:00 o Babies seen by providers at [facility] o Accepting Medicaid . [facility] o Folkston, MD; Kristi Fleenor, NP; West, MD o [address], [place], [place] [address] o [phone] o Mon-Fri 8:00-5:30, Sat&Sun by appointment (phones open at 8:30) o Babies seen by [facility] providers o Accepting Medicaid o Must be a first-time baby or sibling of current patient . [facility] [place]  o [address], [place], [place]  [address] o [phone]   Fax - [phone]  [place] ([address]) . [facility] o Brown, PA; Cowen, PA; Rice, MD; Helton, PA; Spry, MD o [address] [place], [place] [address] o [phone] o Mon-Thur 8:00-7:00, Fri 8:00-5:00, Sat 8:00-12:00, Sun 9:00-12:00 o Babies seen by [facility] providers o Accepting Medicaid . [facility] o Coe-Goins, MD; Marshall, MD; Pierre-Louis, MD o [address], [place], [place] [address] o [phone] o Mon-Thur 8:00-5:00 o Babies seen by providers at [facility] o Accepting Medicaid . [facility] o Bratton, MD; Coe-Goins, MD; Hayes, MD; Lewis, MD; List, MD; Lott, MD; Marshall, MD; Moran, MD; O'Neal, MD; Pierre-Louis, MD; Pitonzo, MD; Scholer, MD; Spangle, MD o [address] [place],    [address] o [phone] o Mon-Fri 8:00-5:30, Sat (Oct.-Mar.) 9:00-1:00 o Babies seen by providers at [facility] o Accepting Medicaid o Must fill out new patient packet, available online at [email or web address] . [facility] ([facility] o Friddle, NP; Harris, NP; Kelly, NP; Logan, MD;  Melvin, PA; Poth, MD; Ramadoss, MD; Stanton, NP o [address], [place], [place] [address] o [phone] o Mon-Thur 8:00-5:30, Fri 8:00-5:00 o Babies seen by providers at [facility] o Accepting Medicaid  [place] ([address]) . [facility] o Dixon, PA; [place], MD; Pickard, MD; Tapia, PA o [address], [place], [place] [address] o [phone] o Mon-Fri 8:00-5:00 o Babies seen by providers at [facility] o Accepting Medicaid   [place] ([address]) . [facility] [place] o Masneri, DO; Meyers, MD; Nelson, PA o [address], [place], [place] [address] o [phone] o Mon-Fri 8:00-5:00 o Babies seen by providers at [facility] o Does NOT accept Medicaid o Limited appointment availability, please call early in hospitalization  . [facility] [place] o Kunedd, DO; McGowen, MD o [address], [place], [place] [address] o [phone] o Mon-Fri 8:00-5:00 o Babies seen by [facility] providers o Does NOT accept Medicaid . [facility] [place] o Cameron, MD; MacDonald, MD; Michaels, PA; Nayak, MD o [address] [place] [address], [place], [place] [address] o [phone] o Mon-Fri 8:00-5:00 o After hours clinic ([address] [place], [place] [address]) [phone] Mon-Fri 5:00-8:00, Sat 12:00-6:00, Sun 10:00-4:00 o Babies seen by [facility] providers o Accepting Medicaid . [facility] [place] o [address], [place], [place]  [address] o [phone]   Fax - [phone]  [place] ([address]) . [facility] [place] [facility] o Andy, MD o [address], [place], [place] [address] o [phone] o Mon-Fri 8:00-5:00 o Babies seen by [facility] providers o Does NOT accept Medicaid . [facility] [place] ([facility] [place]) o Eksir, MD o [address], [place], [place]  [address] o [phone] o Mon-Thur 8:00-7:00, Fri 8:00-5:00, Sat 8:00-12:00 o Babies seen by providers at [facility] o Accepting Medicaid - but does not have vaccinations in office (must be received elsewhere) o Limited availability, please call early in hospitalization  [place] ([address]) . [facility]  o Charlene Flemming, MD o [address], [place]  [address] o [phone]  Fax [phone]   

## 2019-10-12 LAB — OBSTETRIC PANEL, INCLUDING HIV
Antibody Screen: NEGATIVE
Basophils Absolute: 0.1 10*3/uL (ref 0.0–0.2)
Basos: 1 %
EOS (ABSOLUTE): 0.2 10*3/uL (ref 0.0–0.4)
Eos: 1 %
HIV Screen 4th Generation wRfx: NONREACTIVE
Hematocrit: 36.9 % (ref 34.0–46.6)
Hemoglobin: 12.6 g/dL (ref 11.1–15.9)
Hepatitis B Surface Ag: NEGATIVE
Immature Grans (Abs): 0.6 10*3/uL — ABNORMAL HIGH (ref 0.0–0.1)
Immature Granulocytes: 4 %
Lymphocytes Absolute: 2.1 10*3/uL (ref 0.7–3.1)
Lymphs: 15 %
MCH: 30.9 pg (ref 26.6–33.0)
MCHC: 34.1 g/dL (ref 31.5–35.7)
MCV: 90 fL (ref 79–97)
Monocytes Absolute: 0.9 10*3/uL (ref 0.1–0.9)
Monocytes: 6 %
Neutrophils Absolute: 10.8 10*3/uL — ABNORMAL HIGH (ref 1.4–7.0)
Neutrophils: 73 %
Platelets: 308 10*3/uL (ref 150–450)
RBC: 4.08 x10E6/uL (ref 3.77–5.28)
RDW: 13.4 % (ref 11.7–15.4)
RPR Ser Ql: NONREACTIVE
Rh Factor: POSITIVE
Rubella Antibodies, IGG: 1.9 index (ref >0.99)
WBC: 14.7 10*3/uL — ABNORMAL HIGH (ref 3.4–10.8)

## 2019-10-12 LAB — CYTOLOGY - PAP
Chlamydia: NEGATIVE
Comment: NEGATIVE
Comment: NORMAL
Diagnosis: NEGATIVE
Neisseria Gonorrhea: NEGATIVE

## 2019-10-13 LAB — CULTURE, OB URINE

## 2019-10-13 LAB — URINE CULTURE, OB REFLEX

## 2019-10-15 ENCOUNTER — Other Ambulatory Visit: Payer: Self-pay

## 2019-10-15 ENCOUNTER — Telehealth: Payer: Self-pay

## 2019-10-15 DIAGNOSIS — A599 Trichomoniasis, unspecified: Secondary | ICD-10-CM

## 2019-10-15 LAB — POCT URINALYSIS DIP (DEVICE)
Bilirubin Urine: NEGATIVE
Glucose, UA: NEGATIVE mg/dL
Hgb urine dipstick: NEGATIVE
Ketones, ur: NEGATIVE mg/dL
Leukocytes,Ua: NEGATIVE
Nitrite: NEGATIVE
Protein, ur: NEGATIVE mg/dL
Specific Gravity, Urine: 1.025 (ref 1.005–1.030)
Urobilinogen, UA: 0.2 mg/dL (ref 0.0–1.0)
pH: 8.5 — ABNORMAL HIGH (ref 5.0–8.0)

## 2019-10-15 MED ORDER — METRONIDAZOLE 500 MG PO TABS: 500.0000 mg | ORAL_TABLET | Freq: Two times a day (BID) | ORAL | 0 refills | Status: DC

## 2019-10-15 NOTE — Telephone Encounter (Signed)
Called pt to inform of + Trich results & that Rx Flagyl was sent to pharmacy on file.Advise pt to let partner know to get treatment & no sexual contact until 2 weeks after last person treated. Pt verbalized under standing.

## 2019-10-18 ENCOUNTER — Telehealth: Payer: Medicaid - Out of State | Admitting: Emergency Medicine

## 2019-10-18 DIAGNOSIS — J069 Acute upper respiratory infection, unspecified: Secondary | ICD-10-CM

## 2019-10-18 NOTE — Progress Notes (Signed)
E-Visit for Corona Virus Screening  Your current symptoms could be consistent with the coronavirus.  Many health care providers can now test patients at their office but not all are.  Gina Alvarado has multiple testing sites. For information on our [place] testing locations and hours go to [email or web address]  We are enrolling you in our [place] for [place] . Daily you will receive a questionnaire within the [place] website. Our COVID 19 response team will be monitoring your responses daily.  Testing Information: The COVID-19 Community Testing sites will begin testing BY APPOINTMENT ONLY.  You can schedule online at [email or web address]  If you do not have access to a smart phone or computer you may call [phone] for an appointment.   Additional testing sites in the Community:  . For [facility] Testing sites in [facility]  FaceUpdate.uy  . For Pop-up testing sites in [place]  BowlDirectory.co.uk  . For Testing sites with regular hours [email or web address]  . For [place] [email or web address]  . For [facility] [email or web address]  . For [facility] testing in [place] and [employer] [email or web address]  . For [facility] testing in [facility]   [email or web address]  For  more information about community testing call [phone]   Please quarantine yourself while awaiting your test results. Please stay home for a minimum of 10 days from the first day of illness with improving symptoms and you have had 24 hours of no fever (without the use of Tylenol (Acetaminophen)  Motrin (Ibuprofen) or any fever reducing medication).  Also - Do not get tested prior to returning to work because once you have had a positive test the test can stay positive for more then a month in some cases.   You should wear a mask or cloth face covering over your nose and mouth if you must be around other people or animals, including pets (even at home). Try to stay at least 6 feet away from other people. This will protect the people around you.  Please continue good preventive care measures, including:  frequent hand-washing, avoid touching your face, cover coughs/sneezes, stay out of crowds and keep a 6 foot distance from others.  COVID-19 is a respiratory illness with symptoms that are similar to the flu. Symptoms are typically mild to moderate, but there have been cases of severe illness and death due to the virus.   The following symptoms may appear 2-14 days after exposure: . Fever . Cough . Shortness of breath or difficulty breathing . Chills . Repeated shaking with chills . Muscle pain . Headache . Sore throat . New loss of taste or smell . Fatigue . Congestion or runny nose . Nausea or vomiting . Diarrhea  Go to the nearest hospital ED for assessment if fever/cough/breathlessness are severe or illness seems like a threat to life.  It is vitally important that if you feel that you have an infection such as this virus or any other virus that you stay home and away from places where you may spread it to others.  You should avoid contact with people age 29 and older.    You may take acetaminophen (Tylenol) as needed for fever.  Reduce your risk of any infection by using the same precautions used for avoiding the common cold or flu:  Marland Kitchen Wash your hands often with soap and warm water for at least 20 seconds.  If soap and water are not readily available, use an alcohol-based hand sanitizer with at least  60% alcohol.  . If coughing or sneezing, cover your mouth and nose by coughing  or sneezing into the elbow areas of your shirt or coat, into a tissue or into your sleeve (not your hands). . Avoid shaking hands with others and consider head nods or verbal greetings only. . Avoid touching your eyes, nose, or mouth with unwashed hands.  . Avoid close contact with people who are sick. . Avoid places or events with large numbers of people in one location, like concerts or sporting events. . Carefully consider travel plans you have or are making. . If you are planning any travel outside or inside the [place], visit the CDC's Travelers' Health webpage for the latest health notices. . If you have some symptoms but not all symptoms, continue to monitor at home and seek medical attention if your symptoms worsen. . If you are having a medical emergency, call 911.  HOME CARE . Only take medications as instructed by your medical team. . Drink plenty of fluids and get plenty of rest. . A steam or ultrasonic humidifier can help if you have congestion.   GET HELP RIGHT AWAY IF YOU HAVE EMERGENCY WARNING SIGNS** FOR COVID-19. If you or someone is showing any of these signs seek emergency medical care immediately. Call 911 or proceed to your closest emergency facility if: . You develop worsening high fever. . Trouble breathing . Bluish lips or face . Persistent pain or pressure in the chest . New confusion . Inability to wake or stay awake . You cough up blood. . Your symptoms become more severe  **This list is not all possible symptoms. Contact your medical provider for any symptoms that are sever or concerning to you.  MAKE SURE YOU   Understand these instructions.  Will watch your condition.  Will get help right away if you are not doing well or get worse.  Your e-visit answers were reviewed by a board certified advanced clinical practitioner to complete your personal care plan.  Depending on the condition, your plan could have included both over the counter or prescription  medications.  If there is a problem please reply once you have received a response from your provider.  Your safety is important to [place].  If you have drug allergies check your prescription carefully.    You can use MyChart to ask questions about today's visit, request a non-urgent call back, or ask for a work or school excuse for 24 hours related to this e-Visit. If it has been greater than 24 hours you will need to follow up with your provider, or enter a new e-Visit to address those concerns. You will get an e-mail in the next two days asking about your experience.  I hope that your e-visit has been valuable and will speed your recovery. Thank you for using e-visits.  Greater than 5 but less than 10 minutes spent researching, coordinating, and implementing care for this patient today

## 2019-10-22 ENCOUNTER — Other Ambulatory Visit: Payer: Self-pay

## 2019-10-22 NOTE — Progress Notes (Deleted)
Cardiology Office Note:   Date:  10/22/2019  NAME:  Gina Alvarado    MRN: [id number] DOB:  Jul 19, 1990   PCP:  Patient, No Pcp Per  Cardiologist:  No primary care provider on file.  Electrophysiologist:  None   Referring MD: Woodroe Mode, MD   No chief complaint on file. ***  History of Present Illness:   Gina Alvarado is a 29 y.o. female with a hx of DVT, WPW who is being seen today for the evaluation of WPW at the request of Woodroe Mode, MD.  Past Medical History: Past Medical History:  Diagnosis Date  . Anemia    after 1st baby  . DVT of lower extremity (deep venous thrombosis) ([place]) 2018   left  . Epilepsy ([place])   . Kidney stones 2020  . Renal disorder   . WPW (Wolff-Parkinson-White syndrome)     Past Surgical History: Past Surgical History:  Procedure Laterality Date  . APPENDECTOMY    . CESAREAN SECTION    . KIDNEY STONE SURGERY      Current Medications: No outpatient medications have been marked as taking for the 10/24/19 encounter (Appointment) with O'Neal, Cassie Freer, MD.     Allergies:    Other   Social History: Social History   Socioeconomic History  . Marital status: Single    Spouse name: Not on file  . Number of children: Not on file  . Years of education: Not on file  . Highest education level: Not on file  Occupational History  . Not on file  Tobacco Use  . Smoking status: Current Some Day Smoker    Types: Cigarettes  . Smokeless tobacco: Never Used  Substance and Sexual Activity  . Alcohol use: Not Currently    Comment: everyday before pregnancy- liquor daily " alot:  . Drug use: Not Currently    Types: Methamphetamines    Comment: in past, off and on 2012-2018  . Sexual activity: Yes    Birth control/protection: None  Other Topics Concern  . Not on file  Social History Narrative  . Not on file   Social Determinants of Health   Financial Resource Strain:   . Difficulty of Paying Living Expenses:   Food  Insecurity: No Food Insecurity  . Worried About Charity fundraiser in the Last Year: Never true  . Ran Out of Food in the Last Year: Never true  Transportation Needs: No Transportation Needs  . Lack of Transportation (Medical): No  . Lack of Transportation (Non-Medical): No  Physical Activity:   . Days of Exercise per Week:   . Minutes of Exercise per Session:   Stress:   . Feeling of Stress :   Social Connections:   . Frequency of Communication with Friends and Family:   . Frequency of Social Gatherings with Friends and Family:   . Attends Religious Services:   . Active Member of Clubs or Organizations:   . Attends Archivist Meetings:   Marland Kitchen Marital Status:      Family History: The patient's ***family history includes Asthma in her mother; Diabetes in her mother; Hypertension in her mother; Sarcoidosis in her father.  ROS:   All other ROS reviewed and negative. Pertinent positives noted in the HPI.     EKGs/Labs/Other Studies Reviewed:   The following studies were personally reviewed by me today:  EKG:  EKG is *** ordered today.  The ekg ordered today demonstrates ***, and was personally reviewed by me.  Recent Labs: 09/25/2019: ALT 14; BUN 7; Creatinine, Ser 0.69; Potassium 4.0; Sodium 139 10/11/2019: Hemoglobin 12.6; Platelets 308   Recent Lipid Panel No results found for: CHOL, TRIG, HDL, CHOLHDL, VLDL, LDLCALC, LDLDIRECT  Physical Exam:   VS:  LMP 06/16/2019    Wt Readings from Last 3 Encounters:  10/11/19 222 lb 1.6 oz (100.7 kg)  08/01/19 220 lb 12.8 oz (100.2 kg)  07/30/19 220 lb 12.8 oz (100.2 kg)    General: Well nourished, well developed, in no acute distress Heart: Atraumatic, normal size  Eyes: PEERLA, EOMI  Neck: Supple, no JVD Endocrine: No thryomegaly Cardiac: Normal S1, S2; RRR; no murmurs, rubs, or gallops Lungs: Clear to auscultation bilaterally, no wheezing, rhonchi or rales  Abd: Soft, nontender, no hepatomegaly  Ext: No edema, pulses  2+ Musculoskeletal: No deformities, BUE and BLE strength normal and equal Skin: Warm and dry, no rashes   Neuro: Alert and oriented to person, place, time, and situation, CNII-XII grossly intact, no focal deficits  Psych: Normal mood and affect   ASSESSMENT:   Gina Alvarado is a 29 y.o. female who presents for the following: No diagnosis found.  PLAN:   There are no diagnoses linked to this encounter.  Disposition: No follow-ups on file.  Medication Adjustments/Labs and Tests Ordered: Current medicines are reviewed at length with the patient today.  Concerns regarding medicines are outlined above.  No orders of the defined types were placed in this encounter.  No orders of the defined types were placed in this encounter.   There are no Patient Instructions on file for this visit.   Time Spent with Patient: I have spent a total of *** minutes with patient reviewing hospital notes, telemetry, EKGs, labs and examining the patient as well as establishing an assessment and plan that was discussed with the patient.  > 50% of time was spent in direct patient care.  Signed, Lenna Gilford. Flora Lipps, MD [facility]  [address] [place], [place] [address] [phone]  10/22/2019 7:08 AM

## 2019-10-24 ENCOUNTER — Ambulatory Visit: Payer: Medicaid - Out of State | Admitting: Cardiovascular Disease

## 2019-10-25 ENCOUNTER — Other Ambulatory Visit: Payer: Self-pay

## 2019-10-29 ENCOUNTER — Encounter: Payer: Self-pay | Admitting: *Deleted

## 2019-10-30 ENCOUNTER — Ambulatory Visit: Payer: Medicaid - Out of State

## 2019-10-30 ENCOUNTER — Other Ambulatory Visit: Payer: Medicaid - Out of State

## 2019-11-01 ENCOUNTER — Ambulatory Visit: Payer: Self-pay | Admitting: Cardiology

## 2019-11-01 NOTE — Progress Notes (Deleted)
Cardiology Office Note:    Date:  11/01/2019   ID:  Festus Aloe, DOB 08/19/1990, MRN [id number]  PCP:  Patient, No Pcp Per  Cardiologist:  No primary care provider on file.  Electrophysiologist:  None   Referring MD: Woodroe Mode, MD   No chief complaint on file. ***  History of Present Illness:    Gina Alvarado is a 29 y.o. female with a hx of WPW, DVT, epilepsy, anemia who is referred by Dr. Roselie Awkward for evaluation of WPW.  Past Medical History:  Diagnosis Date  . Anemia    after 1st baby  . DVT of lower extremity (deep venous thrombosis) ([place]) 2018   left  . Epilepsy ([place])   . Kidney stones 2020  . Renal disorder   . WPW (Wolff-Parkinson-White syndrome)     Past Surgical History:  Procedure Laterality Date  . APPENDECTOMY    . CESAREAN SECTION    . KIDNEY STONE SURGERY      Current Medications: No outpatient medications have been marked as taking for the 11/01/19 encounter (Appointment) with Donato Heinz, MD.     Allergies:   Other   Social History   Socioeconomic History  . Marital status: Single    Spouse name: Not on file  . Number of children: Not on file  . Years of education: Not on file  . Highest education level: Not on file  Occupational History  . Not on file  Tobacco Use  . Smoking status: Current Some Day Smoker    Types: Cigarettes  . Smokeless tobacco: Never Used  Substance and Sexual Activity  . Alcohol use: Not Currently    Comment: everyday before pregnancy- liquor daily " alot:  . Drug use: Not Currently    Types: Methamphetamines    Comment: in past, off and on 2012-2018  . Sexual activity: Yes    Birth control/protection: None  Other Topics Concern  . Not on file  Social History Narrative  . Not on file   Social Determinants of Health   Financial Resource Strain:   . Difficulty of Paying Living Expenses:   Food Insecurity: No Food Insecurity  . Worried About Charity fundraiser in the Last Year:  Never true  . Ran Out of Food in the Last Year: Never true  Transportation Needs: No Transportation Needs  . Lack of Transportation (Medical): No  . Lack of Transportation (Non-Medical): No  Physical Activity:   . Days of Exercise per Week:   . Minutes of Exercise per Session:   Stress:   . Feeling of Stress :   Social Connections:   . Frequency of Communication with Friends and Family:   . Frequency of Social Gatherings with Friends and Family:   . Attends Religious Services:   . Active Member of Clubs or Organizations:   . Attends Archivist Meetings:   Marland Kitchen Marital Status:      Family History: The patient's ***family history includes Asthma in her mother; Diabetes in her mother; Hypertension in her mother; Sarcoidosis in her father.  ROS:   Please see the history of present illness.    *** All other systems reviewed and are negative.  EKGs/Labs/Other Studies Reviewed:    The following studies were reviewed today: ***  EKG:  EKG is *** ordered today.  The ekg ordered today demonstrates ***  Recent Labs: 09/25/2019: ALT 14; BUN 7; Creatinine, Ser 0.69; Potassium 4.0; Sodium 139 10/11/2019: Hemoglobin 12.6; Platelets 308  Recent Lipid Panel No results found for: CHOL, TRIG, HDL, CHOLHDL, VLDL, LDLCALC, LDLDIRECT  Physical Exam:    VS:  LMP 06/16/2019     Wt Readings from Last 3 Encounters:  10/11/19 222 lb 1.6 oz (100.7 kg)  08/01/19 220 lb 12.8 oz (100.2 kg)  07/30/19 220 lb 12.8 oz (100.2 kg)     GEN: *** Well nourished, well developed in no acute distress HEENT: Normal NECK: No JVD; No carotid bruits LYMPHATICS: No lymphadenopathy CARDIAC: ***RRR, no murmurs, rubs, gallops RESPIRATORY:  Clear to auscultation without rales, wheezing or rhonchi  ABDOMEN: Soft, non-tender, non-distended MUSCULOSKELETAL:  No edema; No deformity  SKIN: Warm and dry NEUROLOGIC:  Alert and oriented x 3 PSYCHIATRIC:  Normal affect   ASSESSMENT:    No diagnosis  found. PLAN:    In order of problems listed above:  WPW:  Lower extremity DVT: Started on Lovenox  RTC in***  Medication Adjustments/Labs and Tests Ordered: Current medicines are reviewed at length with the patient today.  Concerns regarding medicines are outlined above.  No orders of the defined types were placed in this encounter.  No orders of the defined types were placed in this encounter.   There are no Patient Instructions on file for this visit.   Signed, Little Ishikawa, MD  11/01/2019 12:09 AM    [facility]

## 2019-11-05 ENCOUNTER — Encounter: Payer: Self-pay | Admitting: Obstetrics & Gynecology

## 2019-11-06 NOTE — Progress Notes (Signed)
Received fax from [facility] informing the office that the pt declined the Genetic Information Session to discuss results.  If we have any questions to call [facility] at [phone].    Addison Naegeli, RN

## 2019-11-08 ENCOUNTER — Ambulatory Visit: Payer: Medicaid Other | Admitting: *Deleted

## 2019-11-08 ENCOUNTER — Ambulatory Visit: Payer: Medicaid Other | Attending: Obstetrics & Gynecology

## 2019-11-08 ENCOUNTER — Other Ambulatory Visit: Payer: Self-pay | Admitting: *Deleted

## 2019-11-08 ENCOUNTER — Other Ambulatory Visit: Payer: Self-pay

## 2019-11-08 DIAGNOSIS — E669 Obesity, unspecified: Secondary | ICD-10-CM

## 2019-11-08 DIAGNOSIS — Z3A20 20 weeks gestation of pregnancy: Secondary | ICD-10-CM

## 2019-11-08 DIAGNOSIS — O99210 Obesity complicating pregnancy, unspecified trimester: Secondary | ICD-10-CM | POA: Insufficient documentation

## 2019-11-08 DIAGNOSIS — O34219 Maternal care for unspecified type scar from previous cesarean delivery: Secondary | ICD-10-CM

## 2019-11-08 DIAGNOSIS — Z86718 Personal history of other venous thrombosis and embolism: Secondary | ICD-10-CM | POA: Insufficient documentation

## 2019-11-08 DIAGNOSIS — O99212 Obesity complicating pregnancy, second trimester: Secondary | ICD-10-CM

## 2019-11-08 DIAGNOSIS — Z362 Encounter for other antenatal screening follow-up: Secondary | ICD-10-CM

## 2019-11-08 DIAGNOSIS — O0990 Supervision of high risk pregnancy, unspecified, unspecified trimester: Secondary | ICD-10-CM | POA: Diagnosis present

## 2019-11-08 DIAGNOSIS — G40909 Epilepsy, unspecified, not intractable, without status epilepticus: Secondary | ICD-10-CM

## 2019-11-08 DIAGNOSIS — I456 Pre-excitation syndrome: Secondary | ICD-10-CM

## 2019-11-08 DIAGNOSIS — Z363 Encounter for antenatal screening for malformations: Secondary | ICD-10-CM

## 2019-11-08 DIAGNOSIS — O2692 Pregnancy related conditions, unspecified, second trimester: Secondary | ICD-10-CM

## 2019-11-09 ENCOUNTER — Telehealth (INDEPENDENT_AMBULATORY_CARE_PROVIDER_SITE_OTHER): Payer: Self-pay | Admitting: Obstetrics and Gynecology

## 2019-11-09 ENCOUNTER — Encounter: Payer: Self-pay | Admitting: Obstetrics and Gynecology

## 2019-11-09 DIAGNOSIS — Z86718 Personal history of other venous thrombosis and embolism: Secondary | ICD-10-CM

## 2019-11-09 DIAGNOSIS — Z3A20 20 weeks gestation of pregnancy: Secondary | ICD-10-CM

## 2019-11-09 DIAGNOSIS — I456 Pre-excitation syndrome: Secondary | ICD-10-CM

## 2019-11-09 DIAGNOSIS — Z3009 Encounter for other general counseling and advice on contraception: Secondary | ICD-10-CM

## 2019-11-09 DIAGNOSIS — O99210 Obesity complicating pregnancy, unspecified trimester: Secondary | ICD-10-CM

## 2019-11-09 DIAGNOSIS — O99412 Diseases of the circulatory system complicating pregnancy, second trimester: Secondary | ICD-10-CM

## 2019-11-09 DIAGNOSIS — Z98891 History of uterine scar from previous surgery: Secondary | ICD-10-CM | POA: Insufficient documentation

## 2019-11-09 DIAGNOSIS — O99212 Obesity complicating pregnancy, second trimester: Secondary | ICD-10-CM

## 2019-11-09 DIAGNOSIS — O0990 Supervision of high risk pregnancy, unspecified, unspecified trimester: Secondary | ICD-10-CM

## 2019-11-09 DIAGNOSIS — O34219 Maternal care for unspecified type scar from previous cesarean delivery: Secondary | ICD-10-CM

## 2019-11-09 DIAGNOSIS — E669 Obesity, unspecified: Secondary | ICD-10-CM

## 2019-11-09 NOTE — Progress Notes (Signed)
   OBSTETRICS PRENATAL VIRTUAL VISIT ENCOUNTER NOTE  Provider location: [facility] for [facility]   I connected with Forbes Cellar on 11/09/19 at  9:55 AM EDT by MyChart Video Encounter at home and verified that I am speaking with the correct person using two identifiers.   I discussed the limitations, risks, security and privacy concerns of performing an evaluation and management service virtually and the availability of in person appointments. I also discussed with the patient that there may be a patient responsible charge related to this service. The patient expressed understanding and agreed to proceed. Subjective:  Gina Alvarado is a 29 y.o. G3P1011 at [redacted]w[redacted]d being seen today for ongoing prenatal care.  She is currently monitored for the following issues for this high-risk pregnancy and has UTI (urinary tract infection); Supervision of high risk pregnancy, antepartum; WPW (Wolff-Parkinson-White syndrome); Epilepsy (HCC); History of DVT (deep vein thrombosis); Obesity affecting pregnancy; H/O: C-section; and Unwanted fertility on their problem list.  Patient reports no complaints.  Contractions: Not present. Vag. Bleeding: None.  Movement: Present. Denies any leaking of fluid.   The following portions of the patient's history were reviewed and updated as appropriate: allergies, current medications, past family history, past medical history, past social history, past surgical history and problem list.   Objective:  There were no vitals filed for this visit.  Fetal Status:     Movement: Present     General:  Alert, oriented and cooperative. Patient is in no acute distress.  Respiratory: Normal respiratory effort, no problems with respiration noted  Mental Status: Normal mood and affect. Normal behavior. Normal judgment and thought content.  Rest of physical exam deferred due to type of encounter  Imaging:  Assessment and Plan:  Pregnancy: G3P1011 at  [redacted]w[redacted]d  1. Supervision of high risk pregnancy, antepartum Per pt, panorama normal  2. WPW (Wolff-Parkinson-White syndrome) Missed cardiology appointment, she will call to reschedule  3. History of DVT (deep vein thrombosis) Cont lovenox  4. Obesity affecting pregnancy, antepartum  5. H/O: C-section Arrest of dilation at 6 cm at term  6. Unwanted fertility Desires BTL Needs to sign papers   Preterm labor symptoms and general obstetric precautions including but not limited to vaginal bleeding, contractions, leaking of fluid and fetal movement were reviewed in detail with the patient. I discussed the assessment and treatment plan with the patient. The patient was provided an opportunity to ask questions and all were answered. The patient agreed with the plan and demonstrated an understanding of the instructions. The patient was advised to call back or seek an in-person office evaluation/go to [facility] at [facility] for any urgent or concerning symptoms. Please refer to After Visit Summary for other counseling recommendations.   I provided 15 minutes of face-to-face time during this encounter.  Return for high OB, virtual.  Future Appointments  Date Time Provider Department Center  12/06/2019  3:45 PM [facility] US[facility]    Conan Bowens, MD [facility] for [facility], [facility]

## 2019-11-09 NOTE — Progress Notes (Signed)
I connected with  Gina Alvarado on 11/09/19 at  9:55 AM EDT by MyChart and verified that I am speaking with the correct person using two identifiers.   I discussed the limitations, risks, security and privacy concerns of performing an evaluation and management service by telephone and the availability of in person appointments. I also discussed with the patient that there may be a patient responsible charge related to this service. The patient expressed understanding and agreed to proceed.  Pt does not have BP cuff at home; waiting on pregnancy Medicaid. BP yesterday at [facility] was 128/70. Pt reports checking BP whenever she is at the pharmacy.  Marjo Bicker, RN 11/09/2019  9:56 AM

## 2019-11-21 ENCOUNTER — Encounter: Payer: Self-pay | Admitting: *Deleted

## 2019-12-06 ENCOUNTER — Ambulatory Visit: Payer: Medicaid Other | Admitting: *Deleted

## 2019-12-06 ENCOUNTER — Other Ambulatory Visit: Payer: Self-pay

## 2019-12-06 ENCOUNTER — Ambulatory Visit: Payer: Medicaid Other | Attending: Obstetrics and Gynecology

## 2019-12-06 DIAGNOSIS — Z86718 Personal history of other venous thrombosis and embolism: Secondary | ICD-10-CM

## 2019-12-06 DIAGNOSIS — G40909 Epilepsy, unspecified, not intractable, without status epilepticus: Secondary | ICD-10-CM

## 2019-12-06 DIAGNOSIS — O0990 Supervision of high risk pregnancy, unspecified, unspecified trimester: Secondary | ICD-10-CM

## 2019-12-06 DIAGNOSIS — O99212 Obesity complicating pregnancy, second trimester: Secondary | ICD-10-CM

## 2019-12-06 DIAGNOSIS — O2693 Pregnancy related conditions, unspecified, third trimester: Secondary | ICD-10-CM

## 2019-12-06 DIAGNOSIS — E669 Obesity, unspecified: Secondary | ICD-10-CM

## 2019-12-06 DIAGNOSIS — Z362 Encounter for other antenatal screening follow-up: Secondary | ICD-10-CM | POA: Diagnosis present

## 2019-12-06 DIAGNOSIS — O99210 Obesity complicating pregnancy, unspecified trimester: Secondary | ICD-10-CM

## 2019-12-06 DIAGNOSIS — Z3A24 24 weeks gestation of pregnancy: Secondary | ICD-10-CM

## 2019-12-06 DIAGNOSIS — I456 Pre-excitation syndrome: Secondary | ICD-10-CM

## 2019-12-06 DIAGNOSIS — O34219 Maternal care for unspecified type scar from previous cesarean delivery: Secondary | ICD-10-CM

## 2019-12-07 ENCOUNTER — Other Ambulatory Visit: Payer: Self-pay | Admitting: *Deleted

## 2019-12-07 DIAGNOSIS — O99353 Diseases of the nervous system complicating pregnancy, third trimester: Secondary | ICD-10-CM

## 2019-12-10 ENCOUNTER — Telehealth (INDEPENDENT_AMBULATORY_CARE_PROVIDER_SITE_OTHER): Payer: Medicaid Other | Admitting: Family Medicine

## 2019-12-10 DIAGNOSIS — Z86718 Personal history of other venous thrombosis and embolism: Secondary | ICD-10-CM

## 2019-12-10 DIAGNOSIS — G40909 Epilepsy, unspecified, not intractable, without status epilepticus: Secondary | ICD-10-CM

## 2019-12-10 DIAGNOSIS — O99210 Obesity complicating pregnancy, unspecified trimester: Secondary | ICD-10-CM

## 2019-12-10 DIAGNOSIS — O0992 Supervision of high risk pregnancy, unspecified, second trimester: Secondary | ICD-10-CM

## 2019-12-10 DIAGNOSIS — Z98891 History of uterine scar from previous surgery: Secondary | ICD-10-CM

## 2019-12-10 DIAGNOSIS — Z3A25 25 weeks gestation of pregnancy: Secondary | ICD-10-CM

## 2019-12-10 DIAGNOSIS — O99212 Obesity complicating pregnancy, second trimester: Secondary | ICD-10-CM

## 2019-12-10 DIAGNOSIS — O0990 Supervision of high risk pregnancy, unspecified, unspecified trimester: Secondary | ICD-10-CM

## 2019-12-10 DIAGNOSIS — I456 Pre-excitation syndrome: Secondary | ICD-10-CM

## 2019-12-10 NOTE — Progress Notes (Signed)
OBSTETRICS PRENATAL VIRTUAL VISIT ENCOUNTER NOTE  Provider location: [facility] [place] [facility] [place] [facility]   I connected with Gina Alvarado on 12/10/19 at  3:35 PM EDT by MyChart Video Encounter at home and verified that I am speaking with the correct person using two identifiers.   I discussed the limitations, risks, security and privacy concerns of performing an evaluation and management service virtually and the availability of in person appointments. I also discussed with the patient that there may be a patient responsible charge related to this service. The patient expressed understanding and agreed to proceed. Subjective:  Gina Alvarado is a 29 y.o. G3P1011 at [redacted]w[redacted]d being seen today for ongoing prenatal care.  She is currently monitored for the following issues for this high-risk pregnancy and has UTI (urinary tract infection); Supervision of high risk pregnancy, antepartum; WPW (Wolff-Parkinson-White syndrome); Epilepsy ([place]); History of DVT (deep vein thrombosis); Obesity affecting pregnancy; H/O: C-section; and Unwanted fertility on their problem list.  Patient reports no complaints.  Contractions: Not present. Vag. Bleeding: None.  Movement: (!) Decreased. Denies any leaking of fluid.   The following portions of the patient's history were reviewed and updated as appropriate: allergies, current medications, past family history, past medical history, past social history, past surgical history and problem list.   Objective:  There were no vitals filed for this visit.  Fetal Status:     Movement: (!) Decreased     General:  Alert, oriented and cooperative. Patient is in no acute distress.  Respiratory: Normal respiratory effort, no problems with respiration noted  Mental Status: Normal mood and affect. Normal behavior. Normal judgment and thought content.  Rest of physical exam deferred due to type of encounter  Imaging: [place] MFM OB FOLLOW UP  Result Date:  12/06/2019 ----------------------------------------------------------------------  OBSTETRICS REPORT                       (Signed Final 12/06/2019 04:22 pm) ---------------------------------------------------------------------- Patient Info  ID #:       [id number]                          D.O.B.:  24-Jan-1991 (28 yrs)  Name:       Gina Alvarado              Visit Date: 12/06/2019 03:34 pm ---------------------------------------------------------------------- Performed By  Attending:        Johnell Comings MD         Ref. Address:     [address]                                                             [place], [place]  Performed By:     Sybil Shrader Moores BS,       [facility]  Referred By:      [facility] ---------------------------------------------------------------------- Orders  #  Description                           Code        Ordered By  1  [place] MFM OB FOLLOW UP                   [phone]    Noralee Space ----------------------------------------------------------------------  #  Order #                     Accession #                Episode #  1  [id number]                   [id number]                 [id number] ---------------------------------------------------------------------- Indications  Obesity complicating pregnancy, second         O99.212  trimester (pregravid BMI 41)  Medical complication of pregnancy (WPW;        O26.90  hx of DVT) - Lovenox  [redacted] weeks gestation of pregnancy                Z3A.24  Previous cesarean delivery, antepartum         O34.219  Encounter for other antenatal screening        Z36.2  follow-up ---------------------------------------------------------------------- Fetal Evaluation  Num Of Fetuses:         1  Fetal Heart Rate(bpm):  149  Cardiac Activity:       Observed  Presentation:            Cephalic  Placenta:               Anterior  P. Cord Insertion:      Previously Visualized  Amniotic Fluid  AFI FV:      Within normal limits                              Largest Pocket(cm)                              5.5 ---------------------------------------------------------------------- Biometry  BPD:      58.4  mm     G. Age:  23w 6d         17  %    CI:        72.25   %    70 - 86                                                          FL/HC:      20.5   %    18.7 - 20.3  HC:  218.6  mm     G. Age:  23w 6d          9  %    HC/AC:      1.08        1.04 - 1.22  AC:      202.1  mm     G. Age:  24w 6d         45  %    FL/BPD:     76.7   %    71 - 87  FL:       44.8  mm     G. Age:  24w 5d         39  %    FL/AC:      22.2   %    20 - 24  CER:      26.6  mm     G. Age:  23w 6d         36  %  LV:        6.1  mm  Est. FW:     722  gm      1 lb 9 oz     38  % ---------------------------------------------------------------------- OB History  Gravidity:    3         Term:   1        Prem:   0        SAB:   0  TOP:          1       Ectopic:  0        Living: 1 ---------------------------------------------------------------------- Gestational Age  LMP:           24w 5d        Date:  06/16/19                 EDD:   03/22/20  U/S Today:     24w 2d                                        EDD:   03/25/20  Best:          24w 5d     Det. By:  LMP  (06/16/19)          EDD:   03/22/20 ---------------------------------------------------------------------- Anatomy  Cranium:               Appears normal         Aortic Arch:            Appears normal  Cavum:                 Appears normal         Ductal Arch:            Appears normal  Ventricles:            Appears normal         Diaphragm:              Appears normal  Choroid Plexus:        Appears normal         Stomach:                Appears normal, left  sided  Cerebellum:            Appears normal          Abdomen:                Previously seen  Posterior Fossa:       Previously seen        Abdominal Wall:         Previously seen  Nuchal Fold:           Not applicable (>20    Cord Vessels:           Previously seen                         wks GA)  Face:                  Orbits and profile     Kidneys:                Appear normal                         previously seen  Lips:                  Previously seen        Bladder:                Appears normal  Thoracic:              Previously seen        Spine:                  Previously seen  Heart:                 Appears normal         Upper Extremities:      Previously seen                         (4CH, axis, and                         situs)  RVOT:                  Appears normal         Lower Extremities:      Previously seen  LVOT:                  Appears normal  Other:  Heels/feet and open hands/5th digits visualized previously. Nasal          bone visualized previously. Technically difficult due to maternal          habitus and fetal position. ---------------------------------------------------------------------- Cervix Uterus Adnexa  Cervix  Normal appearance by transabdominal scan.  Uterus  No abnormality visualized.  Right Ovary  Within normal limits.  Left Ovary  Within normal limits.  Cul De Sac  No free fluid seen.  Adnexa  No abnormality visualized. ---------------------------------------------------------------------- Comments  This patient was seen for a follow up growth scan due to a  history of Wolff-Parkinson-White syndrome with a history of a  prior DVT.  She remains treated with prophylactic Lovenox.  She denies any problems since her last exam.  She was informed that the fetal growth and amniotic fluid  level appears appropriate for her gestational age.  A follow up exam was scheduled in  5 weeks. ----------------------------------------------------------------------                   Ma RingsVictor Fang, MD Electronically Signed Final Report    12/06/2019 04:22 pm ----------------------------------------------------------------------   Assessment and Plan:  Pregnancy: G3P1011 at 2950w2d 1. Supervision of high risk pregnancy, antepartum Good fetal movement  2. WPW (Wolff-Parkinson-White syndrome) Needs to make appt with Cardiology - missed last appt  3. Nonintractable epilepsy without status epilepticus, unspecified epilepsy type (HCC) No seizure activity  4. History of DVT (deep vein thrombosis) On Lovenox.  39 week induction  5. Obesity affecting pregnancy, antepartum  6. H/O: C-section Desires TOLAC - counseling done. WIll have her sign form next week  Preterm labor symptoms and general obstetric precautions including but not limited to vaginal bleeding, contractions, leaking of fluid and fetal movement were reviewed in detail with the patient. I discussed the assessment and treatment plan with the patient. The patient was provided an opportunity to ask questions and all were answered. The patient agreed with the plan and demonstrated an understanding of the instructions. The patient was advised to call back or seek an in-person office evaluation/[facility] for any urgent or concerning symptoms. Please refer to After Visit Summary for other counseling recommendations.   I provided 13 minutes of face-to-face time during this encounter.  Return in about 3 weeks (around 12/31/2019) for HR OB f/u, 2 hr GTT, In Office.  Future Appointments  Date Time Provider Department Center  12/10/2019  3:35 PM Noralee CharsStinson, Nayden Czajka J, [facility]  01/11/2020  3:45 PM [facility]  01/11/2020  3:45 PM [facility] US4 [facility]    Levie HeritageJacob J Melody Cirrincione, DO [facility]

## 2019-12-10 NOTE — Progress Notes (Signed)
No BP Cuff at home

## 2019-12-27 ENCOUNTER — Other Ambulatory Visit: Payer: Self-pay

## 2019-12-27 ENCOUNTER — Ambulatory Visit (INDEPENDENT_AMBULATORY_CARE_PROVIDER_SITE_OTHER): Payer: Medicaid Other | Admitting: Obstetrics and Gynecology

## 2019-12-27 ENCOUNTER — Encounter: Payer: Self-pay | Admitting: Obstetrics and Gynecology

## 2019-12-27 ENCOUNTER — Other Ambulatory Visit: Payer: Medicaid Other

## 2019-12-27 VITALS — BP 134/73 | HR 93 | Wt 225.9 lb

## 2019-12-27 DIAGNOSIS — Z86718 Personal history of other venous thrombosis and embolism: Secondary | ICD-10-CM

## 2019-12-27 DIAGNOSIS — O99412 Diseases of the circulatory system complicating pregnancy, second trimester: Secondary | ICD-10-CM

## 2019-12-27 DIAGNOSIS — Z3009 Encounter for other general counseling and advice on contraception: Secondary | ICD-10-CM

## 2019-12-27 DIAGNOSIS — Z98891 History of uterine scar from previous surgery: Secondary | ICD-10-CM

## 2019-12-27 DIAGNOSIS — O0990 Supervision of high risk pregnancy, unspecified, unspecified trimester: Secondary | ICD-10-CM

## 2019-12-27 DIAGNOSIS — O0992 Supervision of high risk pregnancy, unspecified, second trimester: Secondary | ICD-10-CM

## 2019-12-27 DIAGNOSIS — I456 Pre-excitation syndrome: Secondary | ICD-10-CM

## 2019-12-27 DIAGNOSIS — Z23 Encounter for immunization: Secondary | ICD-10-CM | POA: Diagnosis not present

## 2019-12-27 DIAGNOSIS — Z3A27 27 weeks gestation of pregnancy: Secondary | ICD-10-CM

## 2019-12-27 DIAGNOSIS — O99212 Obesity complicating pregnancy, second trimester: Secondary | ICD-10-CM

## 2019-12-27 DIAGNOSIS — E669 Obesity, unspecified: Secondary | ICD-10-CM

## 2019-12-27 NOTE — Progress Notes (Signed)
   PRENATAL VISIT NOTE  Subjective:  Gina Alvarado is a 29 y.o. G3P1011 at [redacted]w[redacted]d being seen today for ongoing prenatal care.  She is currently monitored for the following issues for this high-risk pregnancy and has UTI (urinary tract infection); Supervision of high risk pregnancy, antepartum; WPW (Wolff-Parkinson-White syndrome); Epilepsy (HCC); History of DVT (deep vein thrombosis); Obesity affecting pregnancy; H/O: C-section; and Unwanted fertility on their problem list.  Patient reports she is feeling well except she vomited glucola this am..  Contractions: Irritability. Vag. Bleeding: None.  Movement: Present. Denies leaking of fluid.   The following portions of the patient's history were reviewed and updated as appropriate: allergies, current medications, past family history, past medical history, past social history, past surgical history and problem list.   Objective:   Vitals:   12/27/19 0847  BP: 134/73  Pulse: 93  Weight: 225 lb 14.4 oz (102.5 kg)   Fetal Status: Fetal Heart Rate (bpm): 144   Movement: Present     General:  Alert, oriented and cooperative. Patient is in no acute distress.  Skin: Skin is warm and dry. No rash noted.   Cardiovascular: Normal heart rate noted  Respiratory: Normal respiratory effort, no problems with respiration noted  Abdomen: Soft, gravid, appropriate for gestational age.  Pain/Pressure: Present     Pelvic: Cervical exam deferred        Extremities: Normal range of motion.  Edema: Trace  Mental Status: Normal mood and affect. Normal behavior. Normal judgment and thought content.   Assessment and Plan:  Pregnancy: G3P1011 at [redacted]w[redacted]d  1. Supervision of high risk pregnancy, antepartum - threw up glucola - will return for 2 hr GTT  2. History of DVT (deep vein thrombosis) Cont lovenox ppx  3. H/O: C-section H/o FTP at 6 cm Reviewed risks/benefits of TOLAC versus RCS in detail. Patient counseled regarding potential vaginal delivery,  chance of success, future implications, possible uterine rupture and need for urgent/emergent repeat cesarean. Counseled regarding potential need for repeat c-section for reasons unrelated to first c-section. Counseled regarding scheduled repeat cesarean including risks of bleeding, infection, damage to surrounding tissue, abnormal placentation, implications for future pregnancies. All questions answered.  Patient desires TOLAC, consent signed 12/27/2019.  4. Unwanted fertility signed BTL papers today - still considering  5. WPW (Wolff-Parkinson-White syndrome) - missed appt with cardiology, reviewed importance of seeing cardiology for intrapartum management plan - pt will call to set up appt  Preterm labor symptoms and general obstetric precautions including but not limited to vaginal bleeding, contractions, leaking of fluid and fetal movement were reviewed in detail with the patient. Please refer to After Visit Summary for other counseling recommendations.   Return in about 2 weeks (around 01/10/2020) for high OB, in person.  Future Appointments  Date Time Provider Department Center  01/11/2020  3:45 PM [facility] NURSE [facility]  01/11/2020  3:45 PM [facility] US4 [facility]    Conan Bowens, MD

## 2019-12-28 LAB — RPR: RPR Ser Ql: NONREACTIVE

## 2019-12-28 LAB — CBC
Hematocrit: 34.8 % (ref 34.0–46.6)
Hemoglobin: 11.5 g/dL (ref 11.1–15.9)
MCH: 30.5 pg (ref 26.6–33.0)
MCHC: 33 g/dL (ref 31.5–35.7)
MCV: 92 fL (ref 79–97)
Platelets: 366 10*3/uL (ref 150–450)
RBC: 3.77 x10E6/uL (ref 3.77–5.28)
RDW: 13.6 % (ref 11.7–15.4)
WBC: 16 10*3/uL — ABNORMAL HIGH (ref 3.4–10.8)

## 2019-12-28 LAB — HIV ANTIBODY (ROUTINE TESTING W REFLEX): HIV Screen 4th Generation wRfx: NONREACTIVE

## 2019-12-28 LAB — GLUCOSE TOLERANCE, 2 HOURS W/ 1HR: Glucose, Fasting: 76 mg/dL (ref 65–91)

## 2019-12-31 ENCOUNTER — Other Ambulatory Visit: Payer: Medicaid Other

## 2019-12-31 ENCOUNTER — Other Ambulatory Visit: Payer: Self-pay | Admitting: General Practice

## 2019-12-31 DIAGNOSIS — O0990 Supervision of high risk pregnancy, unspecified, unspecified trimester: Secondary | ICD-10-CM

## 2020-01-04 ENCOUNTER — Other Ambulatory Visit: Payer: Medicaid Other

## 2020-01-11 ENCOUNTER — Ambulatory Visit: Payer: Medicaid Other | Admitting: *Deleted

## 2020-01-11 ENCOUNTER — Other Ambulatory Visit: Payer: Self-pay

## 2020-01-11 ENCOUNTER — Ambulatory Visit: Payer: Medicaid Other | Attending: Obstetrics and Gynecology

## 2020-01-11 DIAGNOSIS — G40909 Epilepsy, unspecified, not intractable, without status epilepticus: Secondary | ICD-10-CM

## 2020-01-11 DIAGNOSIS — O0990 Supervision of high risk pregnancy, unspecified, unspecified trimester: Secondary | ICD-10-CM | POA: Diagnosis present

## 2020-01-11 DIAGNOSIS — O99213 Obesity complicating pregnancy, third trimester: Secondary | ICD-10-CM | POA: Diagnosis not present

## 2020-01-11 DIAGNOSIS — O99210 Obesity complicating pregnancy, unspecified trimester: Secondary | ICD-10-CM | POA: Insufficient documentation

## 2020-01-11 DIAGNOSIS — I456 Pre-excitation syndrome: Secondary | ICD-10-CM | POA: Insufficient documentation

## 2020-01-11 DIAGNOSIS — O2693 Pregnancy related conditions, unspecified, third trimester: Secondary | ICD-10-CM | POA: Diagnosis not present

## 2020-01-11 DIAGNOSIS — Z86718 Personal history of other venous thrombosis and embolism: Secondary | ICD-10-CM | POA: Diagnosis present

## 2020-01-11 DIAGNOSIS — O34219 Maternal care for unspecified type scar from previous cesarean delivery: Secondary | ICD-10-CM | POA: Diagnosis not present

## 2020-01-11 DIAGNOSIS — E669 Obesity, unspecified: Secondary | ICD-10-CM

## 2020-01-11 DIAGNOSIS — O99353 Diseases of the nervous system complicating pregnancy, third trimester: Secondary | ICD-10-CM | POA: Diagnosis present

## 2020-01-11 DIAGNOSIS — Z362 Encounter for other antenatal screening follow-up: Secondary | ICD-10-CM | POA: Diagnosis not present

## 2020-01-11 DIAGNOSIS — Z3A29 29 weeks gestation of pregnancy: Secondary | ICD-10-CM

## 2020-01-14 ENCOUNTER — Other Ambulatory Visit: Payer: Self-pay | Admitting: *Deleted

## 2020-01-14 ENCOUNTER — Encounter: Payer: Medicaid Other | Admitting: Obstetrics and Gynecology

## 2020-01-14 DIAGNOSIS — O99213 Obesity complicating pregnancy, third trimester: Secondary | ICD-10-CM

## 2020-01-21 ENCOUNTER — Other Ambulatory Visit: Payer: Self-pay

## 2020-01-21 ENCOUNTER — Inpatient Hospital Stay (HOSPITAL_COMMUNITY)
Admission: AD | Admit: 2020-01-21 | Discharge: 2020-01-21 | Disposition: A | Discharge status: Home / Self Care | Payer: Medicaid Other | Attending: Family Medicine | Admitting: Family Medicine

## 2020-01-21 ENCOUNTER — Encounter (HOSPITAL_COMMUNITY): Payer: Self-pay | Admitting: Family Medicine

## 2020-01-21 DIAGNOSIS — R109 Unspecified abdominal pain: Secondary | ICD-10-CM | POA: Diagnosis not present

## 2020-01-21 DIAGNOSIS — O99333 Smoking (tobacco) complicating pregnancy, third trimester: Secondary | ICD-10-CM | POA: Diagnosis not present

## 2020-01-21 DIAGNOSIS — O26891 Other specified pregnancy related conditions, first trimester: Secondary | ICD-10-CM

## 2020-01-21 DIAGNOSIS — O99891 Other specified diseases and conditions complicating pregnancy: Secondary | ICD-10-CM

## 2020-01-21 DIAGNOSIS — M549 Dorsalgia, unspecified: Secondary | ICD-10-CM | POA: Insufficient documentation

## 2020-01-21 DIAGNOSIS — Z79899 Other long term (current) drug therapy: Secondary | ICD-10-CM | POA: Insufficient documentation

## 2020-01-21 DIAGNOSIS — O26893 Other specified pregnancy related conditions, third trimester: Secondary | ICD-10-CM | POA: Insufficient documentation

## 2020-01-21 DIAGNOSIS — Z7901 Long term (current) use of anticoagulants: Secondary | ICD-10-CM | POA: Insufficient documentation

## 2020-01-21 DIAGNOSIS — G40909 Epilepsy, unspecified, not intractable, without status epilepticus: Secondary | ICD-10-CM | POA: Diagnosis not present

## 2020-01-21 DIAGNOSIS — O99353 Diseases of the nervous system complicating pregnancy, third trimester: Secondary | ICD-10-CM | POA: Diagnosis not present

## 2020-01-21 DIAGNOSIS — Z86718 Personal history of other venous thrombosis and embolism: Secondary | ICD-10-CM | POA: Insufficient documentation

## 2020-01-21 DIAGNOSIS — Z3A31 31 weeks gestation of pregnancy: Secondary | ICD-10-CM | POA: Diagnosis not present

## 2020-01-21 DIAGNOSIS — F17210 Nicotine dependence, cigarettes, uncomplicated: Secondary | ICD-10-CM | POA: Insufficient documentation

## 2020-01-21 DIAGNOSIS — Z3689 Encounter for other specified antenatal screening: Secondary | ICD-10-CM

## 2020-01-21 DIAGNOSIS — R102 Pelvic and perineal pain: Secondary | ICD-10-CM | POA: Insufficient documentation

## 2020-01-21 DIAGNOSIS — O26899 Other specified pregnancy related conditions, unspecified trimester: Secondary | ICD-10-CM

## 2020-01-21 LAB — URINALYSIS, ROUTINE W REFLEX MICROSCOPIC
Bacteria, UA: NONE SEEN
Bilirubin Urine: NEGATIVE
Glucose, UA: NEGATIVE mg/dL
Ketones, ur: NEGATIVE mg/dL
Leukocytes,Ua: NEGATIVE
Nitrite: NEGATIVE
Protein, ur: 30 mg/dL — AB
Specific Gravity, Urine: 1.021 (ref 1.005–1.030)
Squamous Epithelial / HPF: >50 — ABNORMAL HIGH (ref 0–5)
pH: 6 (ref 5.0–8.0)

## 2020-01-21 LAB — WET PREP, GENITAL
Clue Cells Wet Prep HPF POC: NONE SEEN
Sperm: NONE SEEN
Trich, Wet Prep: NONE SEEN
Yeast Wet Prep HPF POC: NONE SEEN

## 2020-01-21 LAB — POCT FERN TEST: POCT Fern Test: NEGATIVE

## 2020-01-21 NOTE — MAU Note (Signed)
.   Gina Alvarado is a 29 y.o. at [redacted]w[redacted]d here in MAU reporting: lower abdominal cramping and sharp pains in her pelvic area. Denis any vaginal bleeding or LOF. +FM  Onset of complaint: 4 am Pain score: 8 Vitals:   01/21/20 1322 01/21/20 1323  BP:  119/67  Pulse: (!) 110   Resp: 16   Temp: 98.6 F (37 C)   SpO2:       FHT:130 Lab orders placed from triage: UA

## 2020-01-21 NOTE — Discharge Instructions (Signed)
Braxton Hicks Contractions Contractions of the uterus can occur throughout pregnancy, but they are not always a sign that you are in labor. You may have practice contractions called Braxton Hicks contractions. These false labor contractions are sometimes confused with true labor. What are Braxton Hicks contractions? Braxton Hicks contractions are tightening movements that occur in the muscles of the uterus before labor. Unlike true labor contractions, these contractions do not result in opening (dilation) and thinning of the cervix. Toward the end of pregnancy (32-34 weeks), Braxton Hicks contractions can happen more often and may become stronger. These contractions are sometimes difficult to tell apart from true labor because they can be very uncomfortable. You should not feel embarrassed if you go to the hospital with false labor. Sometimes, the only way to tell if you are in true labor is for your health care provider to look for changes in the cervix. The health care provider will do a physical exam and may monitor your contractions. If you are not in true labor, the exam should show that your cervix is not dilating and your water has not broken. If there are no other health problems associated with your pregnancy, it is completely safe for you to be sent home with false labor. You may continue to have Braxton Hicks contractions until you go into true labor. How to tell the difference between true labor and false labor True labor  Contractions last 30-70 seconds.  Contractions become very regular.  Discomfort is usually felt in the top of the uterus, and it spreads to the lower abdomen and low back.  Contractions do not go away with walking.  Contractions usually become more intense and increase in frequency.  The cervix dilates and gets thinner. False labor  Contractions are usually shorter and not as strong as true labor contractions.  Contractions are usually irregular.  Contractions  are often felt in the front of the lower abdomen and in the groin.  Contractions may go away when you walk around or change positions while lying down.  Contractions get weaker and are shorter-lasting as time goes on.  The cervix usually does not dilate or become thin. Follow these instructions at home:   Take over-the-counter and prescription medicines only as told by your health care provider.  Keep up with your usual exercises and follow other instructions from your health care provider.  Eat and drink lightly if you think you are going into labor.  If Braxton Hicks contractions are making you uncomfortable: ? Change your position from lying down or resting to walking, or change from walking to resting. ? Sit and rest in a tub of warm water. ? Drink enough fluid to keep your urine pale yellow. Dehydration may cause these contractions. ? Do slow and deep breathing several times an hour.  Keep all follow-up prenatal visits as told by your health care provider. This is important. Contact a health care provider if:  You have a fever.  You have continuous pain in your abdomen. Get help right away if:  Your contractions become stronger, more regular, and closer together.  You have fluid leaking or gushing from your vagina.  You pass blood-tinged mucus (bloody show).  You have bleeding from your vagina.  You have low back pain that you never had before.  You feel your baby's head pushing down and causing pelvic pressure.  Your baby is not moving inside you as much as it used to. Summary  Contractions that occur before labor are   called Braxton Hicks contractions, false labor, or practice contractions.  Braxton Hicks contractions are usually shorter, weaker, farther apart, and less regular than true labor contractions. True labor contractions usually become progressively stronger and regular, and they become more frequent.  Manage discomfort from [facility] contractions  by changing position, resting in a warm bath, drinking plenty of water, or practicing deep breathing. This information is not intended to replace advice given to you by your health care provider. Make sure you discuss any questions you have with your health care provider. Document Revised: 05/20/2017 Document Reviewed: 10/21/2016 Elsevier Patient Education  [employer].   Back Pain in Pregnancy Back pain during pregnancy is common. Back pain may be caused by several factors that are related to changes during your pregnancy. Follow these instructions at home: Managing pain, stiffness, and swelling      If directed, for sudden (acute) back pain, put ice on the painful area. ? Put ice in a plastic bag. ? Place a towel between your skin and the bag. ? Leave the ice on for 20 minutes, 2-3 times per day.  If directed, apply heat to the affected area before you exercise. Use the heat source that your health care provider recommends, such as a moist heat pack or a heating pad. ? Place a towel between your skin and the heat source. ? Leave the heat on for 20-30 minutes. ? Remove the heat if your skin turns bright red. This is especially important if you are unable to feel pain, heat, or cold. You may have a greater risk of getting burned.  If directed, massage the affected area. Activity  Exercise as told by your health care provider. Gentle exercise is the best way to prevent or manage back pain.  Listen to your body when lifting. If lifting hurts, ask for help or bend your knees. This uses your leg muscles instead of your back muscles.  Squat down when picking up something from the floor. Do not bend over.  Only use bed rest for short periods as told by your health care provider. Bed rest should only be used for the most severe episodes of back pain. Standing, sitting, and lying down  Do not stand in one place for long periods of time.  Use good posture when sitting. Make sure  your head rests over your shoulders and is not hanging forward. Use a pillow on your lower back if necessary.  Try sleeping on your side, preferably the left side, with a pregnancy support pillow or 1-2 regular pillows between your legs. ? If you have back pain after a night's rest, your bed may be too soft. ? A firm mattress may provide more support for your back during pregnancy. General instructions  Do not wear high heels.  Eat a healthy diet. Try to gain weight within your health care provider's recommendations.  Use a maternity girdle, elastic sling, or back brace as told by your health care provider.  Take over-the-counter and prescription medicines only as told by your health care provider.  Work with a physical therapist or massage therapist to find ways to manage back pain. Acupuncture or massage therapy may be helpful.  Keep all follow-up visits as told by your health care provider. This is important. Contact a health care provider if:  Your back pain interferes with your daily activities.  You have increasing pain in other parts of your body. Get help right away if:  You develop numbness, tingling, weakness,  or problems with the use of your arms or legs.  You develop severe back pain that is not controlled with medicine.  You have a change in bowel or bladder control.  You develop shortness of breath, dizziness, or you faint.  You develop nausea, vomiting, or sweating.  You have back pain that is a rhythmic, cramping pain similar to labor pains. Labor pain is usually 1-2 minutes apart, lasts for about 1 minute, and involves a bearing down feeling or pressure in your pelvis.  You have back pain and your water breaks or you have vaginal bleeding.  You have back pain or numbness that travels down your leg.  Your back pain developed after you fell.  You develop pain on one side of your back.  You see blood in your urine.  You develop skin blisters in the area of  your back pain. Summary  Back pain may be caused by several factors that are related to changes during your pregnancy.  Follow instructions as told by your health care provider for managing pain, stiffness, and swelling.  Exercise as told by your health care provider. Gentle exercise is the best way to prevent or manage back pain.  Take over-the-counter and prescription medicines only as told by your health care provider.  Keep all follow-up visits as told by your health care provider. This is important. This information is not intended to replace advice given to you by your health care provider. Make sure you discuss any questions you have with your health care provider. Document Revised: 09/26/2018 Document Reviewed: 11/23/2017 Elsevier Patient Education  2020 [employer].

## 2020-01-21 NOTE — ED Provider Notes (Signed)
MSE was initiated and I personally evaluated the patient and placed orders (if any) at  12:41 PM on January 21, 2020.  The patient appears stable so that the remainder of the MSE may be completed by another provider.  Patient placed in Quick Look pathway, seen and evaluated   Chief Complaint: abdominal cramping  HPI:   29yo F who is currently at [redacted] weeks gestation presenting to the ED for abdominal cramping and feeling the urge to urinate. Reports small leakage of fluids this morning. No injuries or falls.  ROS: abdominal cramping (one)  Physical Exam:   Gen: No distress  Neuro: Awake and Alert  Skin: Warm    Focused Exam: tachycardic. Gravid uterus.  12:43 PM I have called over to MAU provider who accepts the patient in transfer.  Patient updated on plan of care and transfer to MAU.  Initiation of care has begun. The patient has been counseled on the process, plan, and necessity for staying for the completion/evaluation, and the remainder of the medical screening examination    Dietrich Pates, PA-C 01/21/20 1245    Terald Sleeper, MD 01/21/20 1753

## 2020-01-21 NOTE — MAU Provider Note (Signed)
History     CSN: [id number]  Arrival date and time: 01/21/20 1227   First Provider Initiated Contact with Patient 01/21/20 1341      Chief Complaint  Patient presents with  . Abdominal Pain  . Pelvic Pain   29 y.o. G2P1001 @31 .2 wks presenting with cramping, back pain, and pelvic pressure. Sx started at 4am. Back pain is bilateral and lumbar. Rates pain 8/10. Has not tried anything for it. Denies urinary sx. Denies VB. +FM. Also reports small trickle of clear fluid around 9am. Had IC last night. She is eating and drinking well. Denies strenuous activity or heavy lifting.   OB History    Gravida  3   Para  1   Term  1   Preterm      AB  1   Living  1     SAB      TAB      Ectopic      Multiple      Live Births  1           Past Medical History:  Diagnosis Date  . Anemia    after 1st baby  . DVT of lower extremity (deep venous thrombosis) (HCC) 2018   left  . Epilepsy (HCC)   . Kidney stones 2020  . Renal disorder   . WPW (Wolff-Parkinson-White syndrome)     Past Surgical History:  Procedure Laterality Date  . APPENDECTOMY    . CESAREAN SECTION    . KIDNEY STONE SURGERY      Family History  Problem Relation Age of Onset  . Diabetes Mother   . Hypertension Mother   . Asthma Mother   . Sarcoidosis Father     Social History   Tobacco Use  . Smoking status: Current Some Day Smoker    Types: Cigarettes  . Smokeless tobacco: Never Used  Vaping Use  . Vaping Use: Never used  Substance Use Topics  . Alcohol use: Not Currently    Comment: everyday before pregnancy- liquor daily " alot:  . 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 Other Anaphylaxis    seafood    No medications prior to admission.    Review of Systems  Gastrointestinal: Positive for abdominal pain.  Genitourinary: Positive for vaginal discharge. Negative for vaginal  bleeding.  Musculoskeletal: Positive for back pain.   Physical Exam   Blood pressure (!) 149/75, pulse 93, temperature 98.6 F (37 C), resp. rate 16, height 5' (1.524 m), weight 103.4 kg, last menstrual period 06/16/2019, SpO2 99 %.  Physical Exam Vitals and nursing note reviewed. Exam conducted with a chaperone present.  Constitutional:      General: She is not in acute distress (appears comfortable). HENT:     Head: Normocephalic and atraumatic.  Pulmonary:     Effort: Pulmonary effort is normal. No respiratory distress.  Abdominal:     General: There is no distension.     Palpations: Abdomen is soft.     Tenderness: There is no abdominal tenderness. There is no right CVA tenderness or left CVA tenderness.  Genitourinary:    Comments: SSE: no pool, fern neg SVE: closed/thick Musculoskeletal:     Cervical back: Normal.     Thoracic back: Normal.     Lumbar back: Normal.  Skin:    General: Skin is warm and dry.  Neurological:     General: No focal deficit present.     Mental Status: She is alert and oriented to person, place, and time.  Psychiatric:        Mood and Affect: Mood normal.   EFM: 140 bpm, mod variability, + accels, no decels Toco: none  Results for orders placed or performed during the hospital encounter of 01/21/20 (from the past 24 hour(s))  Urinalysis, Routine w reflex microscopic     Status: Abnormal   Collection Time: 01/21/20  1:19 PM  Result Value Ref Range   Color, Urine AMBER (A) YELLOW   APPearance CLOUDY (A) CLEAR   Specific Gravity, Urine 1.021 1.005 - 1.030   pH 6.0 5.0 - 8.0   Glucose, UA NEGATIVE NEGATIVE mg/dL   Hgb urine dipstick MODERATE (A) NEGATIVE   Bilirubin Urine NEGATIVE NEGATIVE   Ketones, ur NEGATIVE NEGATIVE mg/dL   Protein, ur 30 (A) NEGATIVE mg/dL   Nitrite NEGATIVE NEGATIVE   Leukocytes,Ua NEGATIVE NEGATIVE   RBC / HPF 21-50 0 - 5 RBC/hpf   WBC, UA 6-10 0 - 5 WBC/hpf   Bacteria, UA NONE SEEN NONE SEEN   Squamous  Epithelial / LPF >50 (H) 0 - 5   Non Squamous Epithelial 0-5 (A) NONE SEEN  Wet prep, genital     Status: Abnormal   Collection Time: 01/21/20  2:03 PM  Result Value Ref Range   Yeast Wet Prep HPF POC NONE SEEN NONE SEEN   Trich, Wet Prep NONE SEEN NONE SEEN   Clue Cells Wet Prep HPF POC NONE SEEN NONE SEEN   WBC, Wet Prep HPF POC FEW (A) NONE SEEN   Sperm NONE SEEN   POCT fern test     Status: None   Collection Time: 01/21/20  2:13 PM  Result Value Ref Range   POCT Fern Test Negative = intact amniotic membranes    MAU Course  Procedures Po hydration  MDM Labs ordered and reviewed. No evidence of SROM or PTL. Pain improved after hydration. UA with blood, UC sent. Stable for discharge home.   Assessment and Plan   1. [redacted] weeks gestation of pregnancy   2. NST (non-stress test) reactive   3. Back pain affecting pregnancy in third trimester   4. Abdominal cramping affecting pregnancy    Discharge home Follow up at [facility] as scheduled PTL precautions Tylenol prn Hydrate  Allergies as of 01/21/2020      Reactions   Other Anaphylaxis   seafood      Medication List    TAKE these medications   acetaminophen 500 MG tablet Commonly known as: TYLENOL Take 1 tablet (500 mg total) by mouth every 6 (six) hours as needed.   enoxaparin 40 MG/0.4ML injection Commonly known as: LOVENOX Inject 0.4 mLs (40 mg total) into the skin daily.   prenatal multivitamin Tabs tablet Take 1 tablet by mouth daily at 12 noon.      Donette Larry, CNM 01/21/2020, 3:22 PM

## 2020-01-22 LAB — GC/CHLAMYDIA PROBE AMP (~~LOC~~) NOT AT ARMC
Chlamydia: NEGATIVE
Comment: NEGATIVE
Comment: NORMAL
Neisseria Gonorrhea: NEGATIVE

## 2020-01-22 LAB — CULTURE, OB URINE

## 2020-01-25 ENCOUNTER — Ambulatory Visit: Payer: Self-pay | Admitting: Cardiovascular Disease

## 2020-01-28 ENCOUNTER — Ambulatory Visit (INDEPENDENT_AMBULATORY_CARE_PROVIDER_SITE_OTHER): Payer: Medicaid Other | Admitting: Obstetrics and Gynecology

## 2020-01-28 ENCOUNTER — Other Ambulatory Visit: Payer: Self-pay

## 2020-01-28 ENCOUNTER — Encounter: Payer: Self-pay | Admitting: Obstetrics and Gynecology

## 2020-01-28 VITALS — BP 121/81 | HR 103 | Wt 228.7 lb

## 2020-01-28 DIAGNOSIS — O99210 Obesity complicating pregnancy, unspecified trimester: Secondary | ICD-10-CM

## 2020-01-28 DIAGNOSIS — I456 Pre-excitation syndrome: Secondary | ICD-10-CM

## 2020-01-28 DIAGNOSIS — Z3A32 32 weeks gestation of pregnancy: Secondary | ICD-10-CM

## 2020-01-28 DIAGNOSIS — O99353 Diseases of the nervous system complicating pregnancy, third trimester: Secondary | ICD-10-CM

## 2020-01-28 DIAGNOSIS — O0990 Supervision of high risk pregnancy, unspecified, unspecified trimester: Secondary | ICD-10-CM

## 2020-01-28 DIAGNOSIS — Z6841 Body Mass Index (BMI) 40.0 and over, adult: Secondary | ICD-10-CM

## 2020-01-28 DIAGNOSIS — G40909 Epilepsy, unspecified, not intractable, without status epilepticus: Secondary | ICD-10-CM

## 2020-01-28 DIAGNOSIS — Z3009 Encounter for other general counseling and advice on contraception: Secondary | ICD-10-CM

## 2020-01-28 DIAGNOSIS — Z98891 History of uterine scar from previous surgery: Secondary | ICD-10-CM

## 2020-01-28 DIAGNOSIS — E669 Obesity, unspecified: Secondary | ICD-10-CM

## 2020-01-28 DIAGNOSIS — Z86718 Personal history of other venous thrombosis and embolism: Secondary | ICD-10-CM

## 2020-01-28 DIAGNOSIS — O34219 Maternal care for unspecified type scar from previous cesarean delivery: Secondary | ICD-10-CM

## 2020-01-29 NOTE — Progress Notes (Addendum)
Prenatal Visit Note Date: 01/28/2020 Clinic: [facility]  Subjective:  Gina Alvarado is a 29 y.o. G3P1011 at [redacted]w[redacted]d being seen today for ongoing prenatal care.  She is currently monitored for the following issues for this high-risk pregnancy and has Supervision of high risk pregnancy, antepartum; WPW (Wolff-Parkinson-White syndrome); Epilepsy (HCC); History of DVT (deep vein thrombosis); Obesity affecting pregnancy; H/O: C-section; Unwanted fertility; and BMI 45.0-49.9, adult (HCC) on their problem list.  Patient reports no complaints.   Contractions: Not present. Vag. Bleeding: None.  Movement: Present. Denies leaking of fluid.   The following portions of the patient's history were reviewed and updated as appropriate: allergies, current medications, past family history, past medical history, past social history, past surgical history and problem list. Problem list updated.  Objective:   Vitals:   01/28/20 1607  BP: 121/81  Pulse: (!) 103  Weight: 228 lb 11.2 oz (103.7 kg)    Fetal Status: Fetal Heart Rate (bpm): 156   Movement: Present     General:  Alert, oriented and cooperative. Patient is in no acute distress.  Skin: Skin is warm and dry. No rash noted.   Cardiovascular: Normal heart rate noted  Respiratory: Normal respiratory effort, no problems with respiration noted  Abdomen: Soft, gravid, appropriate for gestational age. Pain/Pressure: Present     Pelvic:  Cervical exam deferred        Extremities: Normal range of motion.  Edema: None  Mental Status: Normal mood and affect. Normal behavior. Normal judgment and thought content.   Urinalysis:      Assessment and Plan:  Pregnancy: G3P1011 at [redacted]w[redacted]d  1. BMI 45.0-49.9, adult (HCC)  2. H/O: C-section Desires tolac. Form signed today; I didn't see prior one in media  3. Unwanted fertility btl papers signed last visit  4. WPW (Wolff-Parkinson-White syndrome) Patient has missed 3  cardiology appts b/c she states that their new patient visits are in the morning and she always over sleeps and misses them; last appt was on 8/6. Pt to call again for another appt.   She denies any s/s. Importance of cardiology care d/w her. I d/w her that I recommend an early epidural and anesthesia may as well to decrease risk while in labor.   5. Supervision of high risk pregnancy, antepartum Routine care. Has rpt growth u/s on 8/26  6. History of DVT (deep vein thrombosis) Continue ppx lovenox. D/w her that given this, I'd recommend a scheduled IOL at 39wks  7. Obesity affecting pregnancy, antepartum  Preterm labor symptoms and general obstetric precautions including but not limited to vaginal bleeding, contractions, leaking of fluid and fetal movement were reviewed in detail with the patient. Please refer to After Visit Summary for other counseling recommendations.  Return in about 17 days (around 02/14/2020) for high risk, in person.   Stout Bing, MD

## 2020-02-07 ENCOUNTER — Ambulatory Visit: Payer: Medicaid Other

## 2020-02-13 ENCOUNTER — Other Ambulatory Visit: Payer: Self-pay

## 2020-02-13 ENCOUNTER — Ambulatory Visit (INDEPENDENT_AMBULATORY_CARE_PROVIDER_SITE_OTHER): Payer: Medicaid Other | Admitting: Family Medicine

## 2020-02-13 VITALS — BP 113/77 | HR 99 | Wt 228.0 lb

## 2020-02-13 DIAGNOSIS — Z86718 Personal history of other venous thrombosis and embolism: Secondary | ICD-10-CM

## 2020-02-13 DIAGNOSIS — O0990 Supervision of high risk pregnancy, unspecified, unspecified trimester: Secondary | ICD-10-CM

## 2020-02-13 DIAGNOSIS — Z98891 History of uterine scar from previous surgery: Secondary | ICD-10-CM

## 2020-02-13 DIAGNOSIS — O99210 Obesity complicating pregnancy, unspecified trimester: Secondary | ICD-10-CM

## 2020-02-13 DIAGNOSIS — O163 Unspecified maternal hypertension, third trimester: Secondary | ICD-10-CM

## 2020-02-13 DIAGNOSIS — Z6841 Body Mass Index (BMI) 40.0 and over, adult: Secondary | ICD-10-CM

## 2020-02-13 DIAGNOSIS — I456 Pre-excitation syndrome: Secondary | ICD-10-CM

## 2020-02-13 DIAGNOSIS — G40909 Epilepsy, unspecified, not intractable, without status epilepticus: Secondary | ICD-10-CM

## 2020-02-13 DIAGNOSIS — Z3009 Encounter for other general counseling and advice on contraception: Secondary | ICD-10-CM

## 2020-02-13 MED ORDER — BLOOD PRESSURE KIT DEVI: 1.0000 | Freq: Every day | 0 refills | Status: AC

## 2020-02-13 NOTE — Progress Notes (Signed)
Prenatal Visit Note Date: 01/28/2020 Clinic: [facility]  Subjective:  Gina Alvarado is a 29 y.o. G3P1011 at [redacted]w[redacted]d being seen today for ongoing prenatal care.  She is currently monitored for the following issues for this high-risk pregnancy and has Supervision of high risk pregnancy, antepartum; WPW (Wolff-Parkinson-White syndrome); Epilepsy (HCC); History of DVT (deep vein thrombosis); Obesity affecting pregnancy; H/O: C-section; Unwanted fertility; and BMI 45.0-49.9, adult (HCC) on their problem list.  #Elevated BP: patient states she has checked her BP at the pharmacy and it has been elevated into the 140s/100s on several occasions. She endorses intermittent floaters over the past month. Denies cp, sob, RUQ pain.  Contractions: Irritability. Vag. Bleeding: None.  Movement: Present. Denies leaking of fluid.   The following portions of the patient's history were reviewed and updated as appropriate: allergies, current medications, past family history, past medical history, past social history, past surgical history and problem list. Problem list updated.  Objective:   Vitals:   02/13/20 1448 02/13/20 1506  BP: 139/82 113/77  Pulse: (!) 103 99  Weight: 228 lb (103.4 kg)     Fetal Status: Fetal Heart Rate (bpm): 136   Movement: Present     General:  Alert, oriented and cooperative. Patient is in no acute distress.  Skin: Skin is warm and dry. No rash noted.   Cardiovascular: Normal heart rate noted  Respiratory: Normal respiratory effort, no problems with respiration noted  Abdomen: Soft, gravid, appropriate for gestational age. Pain/Pressure: Present     Pelvic:  Cervical exam deferred        Extremities: Normal range of motion.  Edema: None  Mental Status: Normal mood and affect. Normal behavior. Normal judgment and thought content.   Urinalysis:      Assessment and Plan:  Pregnancy: G3P1011 at [redacted]w[redacted]d  1. BMI 45.0-49.9, adult (HCC)  2.  H/O: C-section TOLAC papers signed.  3. Unwanted fertility BTL papers signed, unsure if she wants BTL or not.  4. WPW (Wolff-Parkinson-White syndrome) Patient has missed 3 cardiology appts b/c she states that their new patient visits are in the morning and she always over sleeps and misses them; last appt was on 8/6. Pt to call again for another appt.   She denies any s/s. Importance of cardiology care d/w her. I d/w her that I recommend an early epidural and anesthesia may as well to decrease risk while in labor.  -Patient in agreement to call cardiology for reschedule  5. Supervision of high risk pregnancy, antepartum -Threw up during 2hr GTT, never repeated, will complete 1hr GTT today, FH measuring large for dates (37cm) -Has rpt growth u/s on 8/26 (tomorrow) -Return in 1-2 weeks for GBS/GCC  6. History of DVT (deep vein thrombosis) Continue ppx lovenox. D/w her that given this, I'd recommend a scheduled IOL at 39wks  7. Obesity affecting pregnancy, antepartum  8. Elevated BP without HTN diagnosis -BP elevated today upon initial reading, 139/82 and repeat 113/77 -Patient currently asymptomatic -Does endorse history of intermittent floaters and elevated BP readings at pharmacy -She never picked up BP cuff, will pick up today -Given strict return precautions -Will obtain preE labs today  Preterm labor symptoms and general obstetric precautions including but not limited to vaginal bleeding, contractions, leaking of fluid and fetal movement were reviewed in detail with the patient. Please refer to After Visit Summary for other counseling recommendations.  Return in about 12 days (around 02/25/2020) for [facility]: in person; gbs swab.   Germaine Pomfret,  Broadus John, MD

## 2020-02-14 ENCOUNTER — Ambulatory Visit: Payer: Medicaid Other | Attending: Obstetrics and Gynecology

## 2020-02-14 ENCOUNTER — Ambulatory Visit: Payer: Medicaid Other | Admitting: *Deleted

## 2020-02-14 DIAGNOSIS — G40909 Epilepsy, unspecified, not intractable, without status epilepticus: Secondary | ICD-10-CM

## 2020-02-14 DIAGNOSIS — O0990 Supervision of high risk pregnancy, unspecified, unspecified trimester: Secondary | ICD-10-CM | POA: Diagnosis present

## 2020-02-14 DIAGNOSIS — O34219 Maternal care for unspecified type scar from previous cesarean delivery: Secondary | ICD-10-CM

## 2020-02-14 DIAGNOSIS — Z362 Encounter for other antenatal screening follow-up: Secondary | ICD-10-CM

## 2020-02-14 DIAGNOSIS — Z86718 Personal history of other venous thrombosis and embolism: Secondary | ICD-10-CM | POA: Insufficient documentation

## 2020-02-14 DIAGNOSIS — O2693 Pregnancy related conditions, unspecified, third trimester: Secondary | ICD-10-CM | POA: Diagnosis not present

## 2020-02-14 DIAGNOSIS — I456 Pre-excitation syndrome: Secondary | ICD-10-CM

## 2020-02-14 DIAGNOSIS — O99210 Obesity complicating pregnancy, unspecified trimester: Secondary | ICD-10-CM | POA: Insufficient documentation

## 2020-02-14 DIAGNOSIS — Z3A34 34 weeks gestation of pregnancy: Secondary | ICD-10-CM

## 2020-02-14 DIAGNOSIS — O99213 Obesity complicating pregnancy, third trimester: Secondary | ICD-10-CM | POA: Insufficient documentation

## 2020-02-14 DIAGNOSIS — E669 Obesity, unspecified: Secondary | ICD-10-CM

## 2020-02-14 DIAGNOSIS — Z6841 Body Mass Index (BMI) 40.0 and over, adult: Secondary | ICD-10-CM

## 2020-02-14 LAB — CBC
Hematocrit: 32.4 % — ABNORMAL LOW (ref 34.0–46.6)
Hemoglobin: 11 g/dL — ABNORMAL LOW (ref 11.1–15.9)
MCH: 29.8 pg (ref 26.6–33.0)
MCHC: 34 g/dL (ref 31.5–35.7)
MCV: 88 fL (ref 79–97)
Platelets: 350 10*3/uL (ref 150–450)
RBC: 3.69 x10E6/uL — ABNORMAL LOW (ref 3.77–5.28)
RDW: 14.2 % (ref 11.7–15.4)
WBC: 13.2 10*3/uL — ABNORMAL HIGH (ref 3.4–10.8)

## 2020-02-14 LAB — PROTEIN / CREATININE RATIO, URINE
Creatinine, Urine: 171.1 mg/dL
Protein, Ur: 45.7 mg/dL
Protein/Creat Ratio: 267 mg/g creat — ABNORMAL HIGH (ref 0–200)

## 2020-02-14 LAB — COMPREHENSIVE METABOLIC PANEL
ALT: 13 IU/L (ref 0–32)
AST: 14 IU/L (ref 0–40)
Albumin/Globulin Ratio: 1.2 (ref 1.2–2.2)
Albumin: 3.7 g/dL — ABNORMAL LOW (ref 3.9–5.0)
Alkaline Phosphatase: 115 IU/L (ref 48–121)
BUN/Creatinine Ratio: 10 (ref 9–23)
BUN: 7 mg/dL (ref 6–20)
Bilirubin Total: 0.5 mg/dL (ref 0.0–1.2)
CO2: 17 mmol/L — ABNORMAL LOW (ref 20–29)
Calcium: 9.7 mg/dL (ref 8.7–10.2)
Chloride: 103 mmol/L (ref 96–106)
Creatinine, Ser: 0.68 mg/dL (ref 0.57–1.00)
GFR calc Af Amer: 138 mL/min/{1.73_m2} (ref >59)
GFR calc non Af Amer: 119 mL/min/{1.73_m2} (ref >59)
Globulin, Total: 3 g/dL (ref 1.5–4.5)
Glucose: 116 mg/dL — ABNORMAL HIGH (ref 65–99)
Potassium: 4 mmol/L (ref 3.5–5.2)
Sodium: 137 mmol/L (ref 134–144)
Total Protein: 6.7 g/dL (ref 6.0–8.5)

## 2020-02-14 LAB — GLUCOSE TOLERANCE, 1 HOUR: Glucose, 1Hr PP: 110 mg/dL (ref 65–199)

## 2020-02-25 ENCOUNTER — Encounter (HOSPITAL_COMMUNITY): Payer: Self-pay | Admitting: Obstetrics and Gynecology

## 2020-02-25 ENCOUNTER — Other Ambulatory Visit: Payer: Self-pay

## 2020-02-25 ENCOUNTER — Inpatient Hospital Stay (HOSPITAL_COMMUNITY)
Admission: AD | Admit: 2020-02-25 | Discharge: 2020-02-25 | Disposition: A | Discharge status: Home / Self Care | Payer: Medicaid Other | Attending: Obstetrics and Gynecology | Admitting: Obstetrics and Gynecology

## 2020-02-25 DIAGNOSIS — O4703 False labor before 37 completed weeks of gestation, third trimester: Secondary | ICD-10-CM | POA: Diagnosis not present

## 2020-02-25 DIAGNOSIS — Z86718 Personal history of other venous thrombosis and embolism: Secondary | ICD-10-CM | POA: Insufficient documentation

## 2020-02-25 DIAGNOSIS — O368130 Decreased fetal movements, third trimester, not applicable or unspecified: Secondary | ICD-10-CM | POA: Insufficient documentation

## 2020-02-25 DIAGNOSIS — Z7901 Long term (current) use of anticoagulants: Secondary | ICD-10-CM | POA: Insufficient documentation

## 2020-02-25 DIAGNOSIS — Z3A36 36 weeks gestation of pregnancy: Secondary | ICD-10-CM | POA: Diagnosis not present

## 2020-02-25 DIAGNOSIS — O479 False labor, unspecified: Secondary | ICD-10-CM

## 2020-02-25 LAB — URINALYSIS, ROUTINE W REFLEX MICROSCOPIC
Bilirubin Urine: NEGATIVE
Glucose, UA: NEGATIVE mg/dL
Ketones, ur: NEGATIVE mg/dL
Nitrite: NEGATIVE
Protein, ur: 30 mg/dL — AB
RBC / HPF: >50 RBC/hpf — ABNORMAL HIGH (ref 0–5)
Specific Gravity, Urine: 1.021 (ref 1.005–1.030)
pH: 6 (ref 5.0–8.0)

## 2020-02-25 NOTE — MAU Provider Note (Signed)
  S: Ms. Gina Alvarado is a 29 y.o. G3P1011 at [redacted]w[redacted]d  who presents to MAU today complaining of irreg abd cramping with some bloody mucous with wiping. She denies bright red vaginal bleeding. She denies LOF. She reports some decreased FM on arrival, but has felt movement during her time in MAU.  O: BP (!) 121/52 (BP Location: Right Arm)   Pulse 100   Temp 98.8 F (37.1 C)   Resp 16   Ht 5' (1.524 m)   Wt 106.6 kg   LMP 06/16/2019   SpO2 100%   BMI 45.90 kg/m  GENERAL: Well-developed, well-nourished female in no acute distress.  HEAD: Normocephalic, atraumatic.  CHEST: Normal effort of breathing, regular heart rate ABDOMEN: Soft, nontender, gravid  Cervical exam: (unchanged) Dilation: 1.5 Effacement (%): 40 Cervical Position: Posterior Station: -2 Presentation: Vertex Exam by:: [employer] crew    (Had extended EFM of 4hrs while assessing her) Fetal Monitoring: Cat 1, reactive Baseline: 130-140s Variability: + Accelerations: + Decelerations: none Contractions: irreg with UI   A: SIUP at [redacted]w[redacted]d  Hx DVT (on Lovenox) Hx C/S (plans TOLAC) False labor  P: D/C home with labor precautions (ctx/BRB/leaking fluid) Keep next scheduled OB appt on 9/13 unless needs to be seen prior  Arabella Merles, CNM 02/25/2020 10:21 PM

## 2020-02-25 NOTE — MAU Note (Signed)
Pt c/o increased pelvic pressure and cramping.Reports bloody mucus when she wipes. Reports decreased fetal movement today. Denies any leaking but reports more wetness.

## 2020-02-25 NOTE — Discharge Instructions (Signed)
Braxton Hicks Contractions °Contractions of the uterus can occur throughout pregnancy, but they are not always a sign that you are in labor. You may have practice contractions called Braxton Hicks contractions. These false labor contractions are sometimes confused with true labor. °What are Braxton Hicks contractions? °Braxton Hicks contractions are tightening movements that occur in the muscles of the uterus before labor. Unlike true labor contractions, these contractions do not result in opening (dilation) and thinning of the cervix. Toward the end of pregnancy (32-34 weeks), Braxton Hicks contractions can happen more often and may become stronger. These contractions are sometimes difficult to tell apart from true labor because they can be very uncomfortable. You should not feel embarrassed if you go to the hospital with false labor. °Sometimes, the only way to tell if you are in true labor is for your health care provider to look for changes in the cervix. The health care provider will do a physical exam and may monitor your contractions. If you are not in true labor, the exam should show that your cervix is not dilating and your water has not broken. °If there are no other health problems associated with your pregnancy, it is completely safe for you to be sent home with false labor. You may continue to have Braxton Hicks contractions until you go into true labor. °How to tell the difference between true labor and false labor °True labor °· Contractions last 30-70 seconds. °· Contractions become very regular. °· Discomfort is usually felt in the top of the uterus, and it spreads to the lower abdomen and low back. °· Contractions do not go away with walking. °· Contractions usually become more intense and increase in frequency. °· The cervix dilates and gets thinner. °False labor °· Contractions are usually shorter and not as strong as true labor contractions. °· Contractions are usually irregular. °· Contractions  are often felt in the front of the lower abdomen and in the groin. °· Contractions may go away when you walk around or change positions while lying down. °· Contractions get weaker and are shorter-lasting as time goes on. °· The cervix usually does not dilate or become thin. °Follow these instructions at home: ° °· Take over-the-counter and prescription medicines only as told by your health care provider. °· Keep up with your usual exercises and follow other instructions from your health care provider. °· Eat and drink lightly if you think you are going into labor. °· If Braxton Hicks contractions are making you uncomfortable: °? Change your position from lying down or resting to walking, or change from walking to resting. °? Sit and rest in a tub of warm water. °? Drink enough fluid to keep your urine pale yellow. Dehydration may cause these contractions. °? Do slow and deep breathing several times an hour. °· Keep all follow-up prenatal visits as told by your health care provider. This is important. °Contact a health care provider if: °· You have a fever. °· You have continuous pain in your abdomen. °Get help right away if: °· Your contractions become stronger, more regular, and closer together. °· You have fluid leaking or gushing from your vagina. °· You pass blood-tinged mucus (bloody show). °· You have bleeding from your vagina. °· You have low back pain that you never had before. °· You feel your baby’s head pushing down and causing pelvic pressure. °· Your baby is not moving inside you as much as it used to. °Summary °· Contractions that occur before labor are   called Braxton Hicks contractions, false labor, or practice contractions. °· Braxton Hicks contractions are usually shorter, weaker, farther apart, and less regular than true labor contractions. True labor contractions usually become progressively stronger and regular, and they become more frequent. °· Manage discomfort from Braxton Hicks contractions  by changing position, resting in a warm bath, drinking plenty of water, or practicing deep breathing. °This information is not intended to replace advice given to you by your health care provider. Make sure you discuss any questions you have with your health care provider. °Document Revised: 05/20/2017 Document Reviewed: 10/21/2016 °Elsevier Patient Education © 2020 [employer] ° °

## 2020-03-03 ENCOUNTER — Other Ambulatory Visit: Payer: Self-pay

## 2020-03-03 ENCOUNTER — Encounter: Payer: Self-pay | Admitting: Obstetrics and Gynecology

## 2020-03-03 ENCOUNTER — Ambulatory Visit (INDEPENDENT_AMBULATORY_CARE_PROVIDER_SITE_OTHER): Payer: Medicaid Other | Admitting: Obstetrics and Gynecology

## 2020-03-03 ENCOUNTER — Other Ambulatory Visit (HOSPITAL_COMMUNITY)
Admission: RE | Admit: 2020-03-03 | Discharge: 2020-03-03 | Disposition: A | Discharge status: Home / Self Care | Payer: Medicaid Other | Source: Ambulatory Visit | Attending: Obstetrics and Gynecology | Admitting: Obstetrics and Gynecology

## 2020-03-03 VITALS — BP 122/82 | HR 120 | Wt 133.1 lb

## 2020-03-03 DIAGNOSIS — O0990 Supervision of high risk pregnancy, unspecified, unspecified trimester: Secondary | ICD-10-CM

## 2020-03-03 DIAGNOSIS — Z86718 Personal history of other venous thrombosis and embolism: Secondary | ICD-10-CM

## 2020-03-03 DIAGNOSIS — Z98891 History of uterine scar from previous surgery: Secondary | ICD-10-CM

## 2020-03-03 DIAGNOSIS — Z6841 Body Mass Index (BMI) 40.0 and over, adult: Secondary | ICD-10-CM

## 2020-03-03 DIAGNOSIS — I456 Pre-excitation syndrome: Secondary | ICD-10-CM

## 2020-03-03 NOTE — Patient Instructions (Signed)

## 2020-03-03 NOTE — Progress Notes (Signed)
Subjective:  Gina Alvarado is a 29 y.o. G3P1011 at [redacted]w[redacted]d being seen today for ongoing prenatal care.  She is currently monitored for the following issues for this high-risk pregnancy and has Supervision of high risk pregnancy, antepartum; WPW (Wolff-Parkinson-White syndrome); Epilepsy (HCC); History of DVT (deep vein thrombosis); Obesity affecting pregnancy; H/O: C-section; Unwanted fertility; and BMI 45.0-49.9, adult (HCC) on their problem list.  Patient reports general discomforts of pregnancy.  Contractions: Irritability. Vag. Bleeding: None.  Movement: Present. Denies leaking of fluid.   The following portions of the patient's history were reviewed and updated as appropriate: allergies, current medications, past family history, past medical history, past social history, past surgical history and problem list. Problem list updated.  Objective:   Vitals:   03/03/20 1623  BP: 122/82  Pulse: (!) 120  Weight: 133 lb 1.6 oz (60.4 kg)    Fetal Status: Fetal Heart Rate (bpm): 134   Movement: Present     General:  Alert, oriented and cooperative. Patient is in no acute distress.  Skin: Skin is warm and dry. No rash noted.   Cardiovascular: Normal heart rate noted  Respiratory: Normal respiratory effort, no problems with respiration noted  Abdomen: Soft, gravid, appropriate for gestational age. Pain/Pressure: Present     Pelvic:  Cervical exam performed        Extremities: Normal range of motion.  Edema: Trace  Mental Status: Normal mood and affect. Normal behavior. Normal judgment and thought content.   Urinalysis:      Assessment and Plan:  Pregnancy: G3P1011 at [redacted]w[redacted]d  1. Supervision of high risk pregnancy, antepartum Stable Labor precautions - GC/Chlamydia probe amp ([place])not at [facility] - Culture, beta strep (group b only)  2. History of DVT (deep vein thrombosis) Stable Continue with Lovenox IOL 39-40 weeks, scheduled Discuss Lovenox and IOL at next visit  3. WPW  (Wolff-Parkinson-White syndrome) Stable Still needs to see Cards. Pt to reschedule appt  4. H/O: C-section For TOLAC, consented   5. Unwanted fertility BTL papers signed   Term labor symptoms and general obstetric precautions including but not limited to vaginal bleeding, contractions, leaking of fluid and fetal movement were reviewed in detail with the patient. Please refer to After Visit Summary for other counseling recommendations.  Return in about 1 week (around 03/10/2020) for OB visit, face to face, MD only.   Hermina Staggers, MD

## 2020-03-04 LAB — GC/CHLAMYDIA PROBE AMP (~~LOC~~) NOT AT ARMC
Chlamydia: NEGATIVE
Comment: NEGATIVE
Comment: NORMAL
Neisseria Gonorrhea: NEGATIVE

## 2020-03-06 LAB — CULTURE, BETA STREP (GROUP B ONLY): Strep Gp B Culture: POSITIVE — AB

## 2020-03-07 ENCOUNTER — Telehealth (HOSPITAL_COMMUNITY): Payer: Self-pay | Admitting: *Deleted

## 2020-03-07 ENCOUNTER — Encounter: Payer: Self-pay | Admitting: Obstetrics and Gynecology

## 2020-03-07 ENCOUNTER — Encounter (HOSPITAL_COMMUNITY): Payer: Self-pay | Admitting: *Deleted

## 2020-03-07 DIAGNOSIS — O99820 Streptococcus B carrier state complicating pregnancy: Secondary | ICD-10-CM | POA: Insufficient documentation

## 2020-03-07 NOTE — Telephone Encounter (Signed)
Preadmission screen  

## 2020-03-10 ENCOUNTER — Other Ambulatory Visit (HOSPITAL_COMMUNITY): Payer: Self-pay | Admitting: Advanced Practice Midwife

## 2020-03-10 ENCOUNTER — Other Ambulatory Visit: Payer: Self-pay

## 2020-03-10 ENCOUNTER — Encounter: Payer: Self-pay | Admitting: Obstetrics and Gynecology

## 2020-03-10 ENCOUNTER — Ambulatory Visit (INDEPENDENT_AMBULATORY_CARE_PROVIDER_SITE_OTHER): Payer: Medicaid Other | Admitting: Obstetrics and Gynecology

## 2020-03-10 VITALS — BP 118/82 | HR 109 | Wt 237.2 lb

## 2020-03-10 DIAGNOSIS — Z3009 Encounter for other general counseling and advice on contraception: Secondary | ICD-10-CM

## 2020-03-10 DIAGNOSIS — O0990 Supervision of high risk pregnancy, unspecified, unspecified trimester: Secondary | ICD-10-CM

## 2020-03-10 DIAGNOSIS — Z86718 Personal history of other venous thrombosis and embolism: Secondary | ICD-10-CM

## 2020-03-10 DIAGNOSIS — O99820 Streptococcus B carrier state complicating pregnancy: Secondary | ICD-10-CM

## 2020-03-10 DIAGNOSIS — Z98891 History of uterine scar from previous surgery: Secondary | ICD-10-CM

## 2020-03-10 DIAGNOSIS — I456 Pre-excitation syndrome: Secondary | ICD-10-CM

## 2020-03-10 NOTE — Patient Instructions (Signed)

## 2020-03-10 NOTE — Progress Notes (Signed)
Pt had bleeding during bowel movement, for the last 2 days.

## 2020-03-10 NOTE — Progress Notes (Signed)
Subjective:  Tameaka Eichhorn is a 29 y.o. G3P1011 at [redacted]w[redacted]d being seen today for ongoing prenatal care.  She is currently monitored for the following issues for this high-risk pregnancy and has Supervision of high risk pregnancy, antepartum; WPW (Wolff-Parkinson-White syndrome); Epilepsy (HCC); History of DVT (deep vein thrombosis); Obesity affecting pregnancy; H/O: C-section; Unwanted fertility; BMI 45.0-49.9, adult (HCC); and GBS (group B Streptococcus carrier), +RV culture, currently pregnant on their problem list.  Patient reports general discomforts of pregnancy.  Contractions: Irritability. Vag. Bleeding: None.  Movement: Present. Denies leaking of fluid.   The following portions of the patient's history were reviewed and updated as appropriate: allergies, current medications, past family history, past medical history, past social history, past surgical history and problem list. Problem list updated.  Objective:   Vitals:   03/10/20 1557  BP: 118/82  Pulse: (!) 109  Weight: 237 lb 3.2 oz (107.6 kg)    Fetal Status: Fetal Heart Rate (bpm): 147   Movement: Present     General:  Alert, oriented and cooperative. Patient is in no acute distress.  Skin: Skin is warm and dry. No rash noted.   Cardiovascular: Normal heart rate noted  Respiratory: Normal respiratory effort, no problems with respiration noted  Abdomen: Soft, gravid, appropriate for gestational age. Pain/Pressure: Present     Pelvic:  Cervical exam performed        Extremities: Normal range of motion.  Edema: Trace  Mental Status: Normal mood and affect. Normal behavior. Normal judgment and thought content.   Urinalysis:      Assessment and Plan:  Pregnancy: G3P1011 at [redacted]w[redacted]d  1. Supervision of high risk pregnancy, antepartum IOL scheduled for Sat  2. History of DVT (deep vein thrombosis) Stable on Lovenox Pt instructed to take last Lovenox injection on Thursday  3. WPW (Wolff-Parkinson-White syndrome) To see  cards  4. GBS (group B Streptococcus carrier), +RV culture, currently pregnant Tx while in labor  5. Unwanted fertility BTL papers signed  6. H/O: C-section TOLAC consent obtained  Term labor symptoms and general obstetric precautions including but not limited to vaginal bleeding, contractions, leaking of fluid and fetal movement were reviewed in detail with the patient. Please refer to After Visit Summary for other counseling recommendations.  Return in about 4 weeks (around 04/07/2020) for PP visit from 03/15/20, face to face, MD provider.   Hermina Staggers, MD

## 2020-03-11 ENCOUNTER — Other Ambulatory Visit: Payer: Self-pay | Admitting: Family Medicine

## 2020-03-12 ENCOUNTER — Other Ambulatory Visit: Payer: Self-pay | Admitting: Advanced Practice Midwife

## 2020-03-13 ENCOUNTER — Other Ambulatory Visit (HOSPITAL_COMMUNITY)
Admission: RE | Admit: 2020-03-13 | Discharge: 2020-03-13 | Disposition: A | Discharge status: Home / Self Care | Payer: Medicaid Other | Source: Ambulatory Visit | Attending: Family Medicine | Admitting: Family Medicine

## 2020-03-13 DIAGNOSIS — Z20822 Contact with and (suspected) exposure to covid-19: Secondary | ICD-10-CM | POA: Diagnosis not present

## 2020-03-13 DIAGNOSIS — Z01812 Encounter for preprocedural laboratory examination: Secondary | ICD-10-CM | POA: Insufficient documentation

## 2020-03-13 LAB — SARS CORONAVIRUS 2 (TAT 6-24 HRS): SARS Coronavirus 2: NEGATIVE

## 2020-03-15 ENCOUNTER — Encounter (HOSPITAL_COMMUNITY): Payer: Self-pay | Admitting: Obstetrics & Gynecology

## 2020-03-15 ENCOUNTER — Encounter (HOSPITAL_COMMUNITY)
Admission: AD | Disposition: A | Discharge status: Home / Self Care | Payer: Self-pay | Source: Home / Self Care | Attending: Obstetrics & Gynecology

## 2020-03-15 ENCOUNTER — Inpatient Hospital Stay (HOSPITAL_COMMUNITY)
Admission: AD | Admit: 2020-03-15 | Discharge: 2020-03-17 | DRG: 787 | Disposition: A | Discharge status: Home / Self Care | Payer: Medicaid Other | Attending: Obstetrics & Gynecology | Admitting: Obstetrics & Gynecology

## 2020-03-15 ENCOUNTER — Inpatient Hospital Stay (HOSPITAL_COMMUNITY): Payer: Medicaid Other | Admitting: Anesthesiology

## 2020-03-15 ENCOUNTER — Inpatient Hospital Stay (HOSPITAL_COMMUNITY): Payer: Medicaid Other

## 2020-03-15 ENCOUNTER — Other Ambulatory Visit: Payer: Self-pay

## 2020-03-15 DIAGNOSIS — O99214 Obesity complicating childbirth: Secondary | ICD-10-CM | POA: Diagnosis present

## 2020-03-15 DIAGNOSIS — Z98891 History of uterine scar from previous surgery: Secondary | ICD-10-CM

## 2020-03-15 DIAGNOSIS — Z3A39 39 weeks gestation of pregnancy: Secondary | ICD-10-CM

## 2020-03-15 DIAGNOSIS — Z7901 Long term (current) use of anticoagulants: Secondary | ICD-10-CM | POA: Diagnosis not present

## 2020-03-15 DIAGNOSIS — D62 Acute posthemorrhagic anemia: Secondary | ICD-10-CM | POA: Diagnosis not present

## 2020-03-15 DIAGNOSIS — Z86718 Personal history of other venous thrombosis and embolism: Secondary | ICD-10-CM | POA: Diagnosis not present

## 2020-03-15 DIAGNOSIS — O34211 Maternal care for low transverse scar from previous cesarean delivery: Secondary | ICD-10-CM

## 2020-03-15 DIAGNOSIS — B951 Streptococcus, group B, as the cause of diseases classified elsewhere: Secondary | ICD-10-CM

## 2020-03-15 DIAGNOSIS — I456 Pre-excitation syndrome: Secondary | ICD-10-CM | POA: Diagnosis present

## 2020-03-15 DIAGNOSIS — O9942 Diseases of the circulatory system complicating childbirth: Secondary | ICD-10-CM | POA: Diagnosis present

## 2020-03-15 DIAGNOSIS — O99824 Streptococcus B carrier state complicating childbirth: Secondary | ICD-10-CM

## 2020-03-15 DIAGNOSIS — O9081 Anemia of the puerperium: Secondary | ICD-10-CM | POA: Diagnosis not present

## 2020-03-15 DIAGNOSIS — O99210 Obesity complicating pregnancy, unspecified trimester: Secondary | ICD-10-CM | POA: Diagnosis present

## 2020-03-15 HISTORY — DX: Other psychoactive substance abuse, in remission: F19.11

## 2020-03-15 HISTORY — DX: Personal history of physical and sexual abuse in childhood: Z62.810

## 2020-03-15 LAB — CBC
HCT: 31.2 % — ABNORMAL LOW (ref 36.0–46.0)
Hemoglobin: 10.1 g/dL — ABNORMAL LOW (ref 12.0–15.0)
MCH: 28.9 pg (ref 26.0–34.0)
MCHC: 32.4 g/dL (ref 30.0–36.0)
MCV: 89.4 fL (ref 80.0–100.0)
Platelets: 373 10*3/uL (ref 150–400)
RBC: 3.49 MIL/uL — ABNORMAL LOW (ref 3.87–5.11)
RDW: 15.9 % — ABNORMAL HIGH (ref 11.5–15.5)
WBC: 13.3 10*3/uL — ABNORMAL HIGH (ref 4.0–10.5)
nRBC: 0.6 % — ABNORMAL HIGH (ref 0.0–0.2)

## 2020-03-15 LAB — TYPE AND SCREEN
ABO/RH(D): B POS
Antibody Screen: NEGATIVE

## 2020-03-15 LAB — RPR: RPR Ser Ql: NONREACTIVE

## 2020-03-15 LAB — CREATININE, SERUM
Creatinine, Ser: 0.69 mg/dL (ref 0.44–1.00)
GFR calc Af Amer: >60 mL/min (ref >60)
GFR calc non Af Amer: >60 mL/min (ref >60)

## 2020-03-15 SURGERY — Surgical Case
Anesthesia: Spinal

## 2020-03-15 MED ORDER — ACETAMINOPHEN 325 MG PO TABS: 650.0000 mg | ORAL_TABLET | ORAL | Status: DC | PRN

## 2020-03-15 MED ORDER — CEFAZOLIN SODIUM-DEXTROSE 2-4 GM/100ML-% IV SOLN
INTRAVENOUS | Status: AC
  Filled 2020-03-15: qty 100

## 2020-03-15 MED ORDER — KETOROLAC TROMETHAMINE 30 MG/ML IJ SOLN
30.0000 mg | Freq: Four times a day (QID) | INTRAMUSCULAR | Status: AC | PRN
  Administered 2020-03-16: 30 mg via INTRAMUSCULAR

## 2020-03-15 MED ORDER — KETOROLAC TROMETHAMINE 30 MG/ML IJ SOLN: 30.0000 mg | Freq: Four times a day (QID) | INTRAMUSCULAR | Status: AC | PRN

## 2020-03-15 MED ORDER — PHENYLEPHRINE HCL-NACL 20-0.9 MG/250ML-% IV SOLN
INTRAVENOUS | Status: AC
  Filled 2020-03-15: qty 250

## 2020-03-15 MED ORDER — MEDROXYPROGESTERONE ACETATE 150 MG/ML IM SUSP
150.0000 mg | Freq: Once | INTRAMUSCULAR | Status: AC
  Administered 2020-03-17: 150 mg via INTRAMUSCULAR
  Filled 2020-03-15: qty 1

## 2020-03-15 MED ORDER — SIMETHICONE 80 MG PO CHEW
80.0000 mg | CHEWABLE_TABLET | Freq: Three times a day (TID) | ORAL | Status: DC
  Administered 2020-03-16 – 2020-03-17 (×3): 80 mg via ORAL
  Filled 2020-03-15 (×3): qty 1

## 2020-03-15 MED ORDER — SOD CITRATE-CITRIC ACID 500-334 MG/5ML PO SOLN
30.0000 mL | ORAL | Status: AC
  Administered 2020-03-15: 30 mL via ORAL

## 2020-03-15 MED ORDER — GABAPENTIN 100 MG PO CAPS
100.0000 mg | ORAL_CAPSULE | Freq: Every day | ORAL | Status: DC
  Administered 2020-03-15 – 2020-03-16 (×2): 100 mg via ORAL
  Filled 2020-03-15 (×2): qty 1

## 2020-03-15 MED ORDER — LACTATED RINGERS IV SOLN: 500.0000 mL | INTRAVENOUS | Status: DC | PRN

## 2020-03-15 MED ORDER — OXYTOCIN BOLUS FROM INFUSION: 333.0000 mL | Freq: Once | INTRAVENOUS | Status: DC

## 2020-03-15 MED ORDER — FENTANYL CITRATE (PF) 100 MCG/2ML IJ SOLN: 100.0000 ug | INTRAMUSCULAR | Status: DC | PRN

## 2020-03-15 MED ORDER — TETANUS-DIPHTH-ACELL PERTUSSIS 5-2.5-18.5 LF-MCG/0.5 IM SUSP: 0.5000 mL | Freq: Once | INTRAMUSCULAR | Status: DC

## 2020-03-15 MED ORDER — PRENATAL MULTIVITAMIN CH
1.0000 | ORAL_TABLET | Freq: Every day | ORAL | Status: DC
  Administered 2020-03-16 – 2020-03-17 (×2): 1 via ORAL
  Filled 2020-03-15 (×2): qty 1

## 2020-03-15 MED ORDER — MORPHINE SULFATE (PF) 0.5 MG/ML IJ SOLN
INTRAMUSCULAR | Status: DC | PRN
Start: 2020-03-15 — End: 2020-03-15
  Administered 2020-03-15: .15 ug via INTRATHECAL

## 2020-03-15 MED ORDER — OXYTOCIN-SODIUM CHLORIDE 30-0.9 UT/500ML-% IV SOLN
INTRAVENOUS | Status: DC | PRN
  Administered 2020-03-15: 30 [IU] via INTRAVENOUS

## 2020-03-15 MED ORDER — OXYTOCIN-SODIUM CHLORIDE 30-0.9 UT/500ML-% IV SOLN
2.5000 [IU]/h | INTRAVENOUS | Status: DC
  Filled 2020-03-15: qty 500

## 2020-03-15 MED ORDER — KETOROLAC TROMETHAMINE 30 MG/ML IJ SOLN
30.0000 mg | Freq: Once | INTRAMUSCULAR | Status: AC | PRN
  Administered 2020-03-15: 30 mg via INTRAVENOUS

## 2020-03-15 MED ORDER — SENNOSIDES-DOCUSATE SODIUM 8.6-50 MG PO TABS
2.0000 | ORAL_TABLET | ORAL | Status: DC
  Administered 2020-03-15: 2 via ORAL
  Filled 2020-03-15 (×3): qty 2

## 2020-03-15 MED ORDER — ACETAMINOPHEN 500 MG PO TABS
1000.0000 mg | ORAL_TABLET | Freq: Three times a day (TID) | ORAL | Status: DC
  Administered 2020-03-15 – 2020-03-17 (×4): 1000 mg via ORAL
  Filled 2020-03-15 (×5): qty 2

## 2020-03-15 MED ORDER — COCONUT OIL OIL: 1.0000 "application " | TOPICAL_OIL | Status: DC | PRN

## 2020-03-15 MED ORDER — NALBUPHINE HCL 10 MG/ML IJ SOLN
5.0000 mg | Freq: Once | INTRAMUSCULAR | Status: AC | PRN
  Administered 2020-03-15: 5 mg via INTRAVENOUS

## 2020-03-15 MED ORDER — SCOPOLAMINE 1 MG/3DAYS TD PT72
1.0000 | MEDICATED_PATCH | Freq: Once | TRANSDERMAL | Status: DC
  Administered 2020-03-15: 1.5 mg via TRANSDERMAL

## 2020-03-15 MED ORDER — DIBUCAINE (PERIANAL) 1 % EX OINT: 1.0000 "application " | TOPICAL_OINTMENT | CUTANEOUS | Status: DC | PRN

## 2020-03-15 MED ORDER — OXYCODONE-ACETAMINOPHEN 5-325 MG PO TABS: 1.0000 | ORAL_TABLET | ORAL | Status: DC | PRN

## 2020-03-15 MED ORDER — TERBUTALINE SULFATE 1 MG/ML IJ SOLN: 0.2500 mg | Freq: Once | INTRAMUSCULAR | Status: DC | PRN

## 2020-03-15 MED ORDER — SOD CITRATE-CITRIC ACID 500-334 MG/5ML PO SOLN
30.0000 mL | ORAL | Status: DC | PRN
  Filled 2020-03-15: qty 15

## 2020-03-15 MED ORDER — SCOPOLAMINE 1 MG/3DAYS TD PT72
MEDICATED_PATCH | TRANSDERMAL | Status: AC
  Filled 2020-03-15: qty 1

## 2020-03-15 MED ORDER — PENICILLIN G POT IN DEXTROSE 60000 UNIT/ML IV SOLN
3.0000 10*6.[IU] | INTRAVENOUS | Status: DC
  Administered 2020-03-15 (×2): 3 10*6.[IU] via INTRAVENOUS
  Filled 2020-03-15 (×2): qty 50

## 2020-03-15 MED ORDER — OXYTOCIN-SODIUM CHLORIDE 30-0.9 UT/500ML-% IV SOLN: 2.5000 [IU]/h | INTRAVENOUS | Status: AC

## 2020-03-15 MED ORDER — NALBUPHINE HCL 10 MG/ML IJ SOLN
INTRAMUSCULAR | Status: AC
  Filled 2020-03-15: qty 1

## 2020-03-15 MED ORDER — LACTATED RINGERS IV SOLN: INTRAVENOUS | Status: DC | PRN

## 2020-03-15 MED ORDER — OXYTOCIN-SODIUM CHLORIDE 30-0.9 UT/500ML-% IV SOLN
1.0000 m[IU]/min | INTRAVENOUS | Status: DC
  Administered 2020-03-15: 2 m[IU]/min via INTRAVENOUS

## 2020-03-15 MED ORDER — DIPHENHYDRAMINE HCL 50 MG/ML IJ SOLN
INTRAMUSCULAR | Status: DC | PRN
  Administered 2020-03-15 (×2): 12.5 mg via INTRAVENOUS

## 2020-03-15 MED ORDER — SIMETHICONE 80 MG PO CHEW
80.0000 mg | CHEWABLE_TABLET | ORAL | Status: DC
  Administered 2020-03-15: 80 mg via ORAL
  Filled 2020-03-15 (×2): qty 1

## 2020-03-15 MED ORDER — DIPHENHYDRAMINE HCL 25 MG PO CAPS
25.0000 mg | ORAL_CAPSULE | Freq: Four times a day (QID) | ORAL | Status: DC | PRN
  Administered 2020-03-15 – 2020-03-16 (×2): 25 mg via ORAL
  Filled 2020-03-15 (×2): qty 1

## 2020-03-15 MED ORDER — PHENYLEPHRINE 40 MCG/ML (10ML) SYRINGE FOR IV PUSH (FOR BLOOD PRESSURE SUPPORT)
PREFILLED_SYRINGE | INTRAVENOUS | Status: AC
  Filled 2020-03-15: qty 10

## 2020-03-15 MED ORDER — ONDANSETRON HCL 4 MG/2ML IJ SOLN
4.0000 mg | Freq: Four times a day (QID) | INTRAMUSCULAR | Status: DC | PRN
  Administered 2020-03-15: 4 mg via INTRAVENOUS

## 2020-03-15 MED ORDER — MENTHOL 3 MG MT LOZG: 1.0000 | LOZENGE | OROMUCOSAL | Status: DC | PRN

## 2020-03-15 MED ORDER — WITCH HAZEL-GLYCERIN EX PADS: 1.0000 "application " | MEDICATED_PAD | CUTANEOUS | Status: DC | PRN

## 2020-03-15 MED ORDER — KETOROLAC TROMETHAMINE 30 MG/ML IJ SOLN
INTRAMUSCULAR | Status: AC
  Filled 2020-03-15: qty 1

## 2020-03-15 MED ORDER — FENTANYL CITRATE (PF) 100 MCG/2ML IJ SOLN
INTRAMUSCULAR | Status: AC
  Filled 2020-03-15: qty 2

## 2020-03-15 MED ORDER — ENOXAPARIN SODIUM 60 MG/0.6ML ~~LOC~~ SOLN
0.5000 mg/kg | SUBCUTANEOUS | Status: DC
  Administered 2020-03-16 – 2020-03-17 (×2): 55 mg via SUBCUTANEOUS
  Filled 2020-03-15 (×2): qty 0.6

## 2020-03-15 MED ORDER — CEFAZOLIN SODIUM-DEXTROSE 2-4 GM/100ML-% IV SOLN: 2.0000 g | INTRAVENOUS | Status: DC

## 2020-03-15 MED ORDER — DIPHENHYDRAMINE HCL 50 MG/ML IJ SOLN
INTRAMUSCULAR | Status: AC
  Filled 2020-03-15: qty 1

## 2020-03-15 MED ORDER — OXYCODONE-ACETAMINOPHEN 5-325 MG PO TABS: 2.0000 | ORAL_TABLET | ORAL | Status: DC | PRN

## 2020-03-15 MED ORDER — CEFAZOLIN SODIUM-DEXTROSE 2-3 GM-%(50ML) IV SOLR
INTRAVENOUS | Status: DC | PRN
  Administered 2020-03-15: 2 g via INTRAVENOUS

## 2020-03-15 MED ORDER — IBUPROFEN 800 MG PO TABS
800.0000 mg | ORAL_TABLET | Freq: Four times a day (QID) | ORAL | Status: DC
  Administered 2020-03-17 (×2): 800 mg via ORAL
  Filled 2020-03-15 (×3): qty 1

## 2020-03-15 MED ORDER — SODIUM CHLORIDE 0.9 % IV SOLN: INTRAVENOUS | Status: DC | PRN

## 2020-03-15 MED ORDER — LACTATED RINGERS IV SOLN: INTRAVENOUS | Status: DC

## 2020-03-15 MED ORDER — LIDOCAINE HCL (PF) 1 % IJ SOLN: 30.0000 mL | INTRAMUSCULAR | Status: DC | PRN

## 2020-03-15 MED ORDER — NALBUPHINE HCL 10 MG/ML IJ SOLN: 5.0000 mg | Freq: Once | INTRAMUSCULAR | Status: AC | PRN

## 2020-03-15 MED ORDER — MORPHINE SULFATE (PF) 0.5 MG/ML IJ SOLN
INTRAMUSCULAR | Status: AC
  Filled 2020-03-15: qty 10

## 2020-03-15 MED ORDER — MEASLES, MUMPS & RUBELLA VAC IJ SOLR: 0.5000 mL | Freq: Once | INTRAMUSCULAR | Status: DC

## 2020-03-15 MED ORDER — HYDROMORPHONE HCL 1 MG/ML IJ SOLN: 0.2500 mg | INTRAMUSCULAR | Status: DC | PRN

## 2020-03-15 MED ORDER — PHENYLEPHRINE HCL (PRESSORS) 10 MG/ML IV SOLN
INTRAVENOUS | Status: DC | PRN
  Administered 2020-03-15 (×2): 100 ug via INTRAVENOUS

## 2020-03-15 MED ORDER — SODIUM CHLORIDE 0.9 % IR SOLN
Status: DC | PRN
  Administered 2020-03-15: 1000 mL

## 2020-03-15 MED ORDER — BUPIVACAINE IN DEXTROSE 0.75-8.25 % IT SOLN
INTRATHECAL | Status: DC | PRN
  Administered 2020-03-15: 1.8 mL via INTRATHECAL

## 2020-03-15 MED ORDER — ONDANSETRON HCL 4 MG/2ML IJ SOLN
INTRAMUSCULAR | Status: AC
  Filled 2020-03-15: qty 2

## 2020-03-15 MED ORDER — SODIUM CHLORIDE 0.9 % IV SOLN
5.0000 10*6.[IU] | Freq: Once | INTRAVENOUS | Status: AC
  Administered 2020-03-15: 5 10*6.[IU] via INTRAVENOUS
  Filled 2020-03-15: qty 5

## 2020-03-15 MED ORDER — KETOROLAC TROMETHAMINE 30 MG/ML IJ SOLN
30.0000 mg | Freq: Four times a day (QID) | INTRAMUSCULAR | Status: AC
  Administered 2020-03-16 (×2): 30 mg via INTRAVENOUS
  Filled 2020-03-15 (×4): qty 1

## 2020-03-15 MED ORDER — PHENYLEPHRINE HCL-NACL 20-0.9 MG/250ML-% IV SOLN
INTRAVENOUS | Status: DC | PRN
  Administered 2020-03-15: 60 ug/min via INTRAVENOUS

## 2020-03-15 MED ORDER — OXYCODONE HCL 5 MG PO TABS: 5.0000 mg | ORAL_TABLET | ORAL | Status: DC | PRN

## 2020-03-15 MED ORDER — FENTANYL CITRATE (PF) 100 MCG/2ML IJ SOLN
INTRAMUSCULAR | Status: DC | PRN
Start: 2020-03-15 — End: 2020-03-15
  Administered 2020-03-15: 15 ug via INTRATHECAL

## 2020-03-15 MED ORDER — SIMETHICONE 80 MG PO CHEW: 80.0000 mg | CHEWABLE_TABLET | ORAL | Status: DC | PRN

## 2020-03-15 SURGICAL SUPPLY — 37 items
BENZOIN TINCTURE PRP APPL 2/3 (GAUZE/BANDAGES/DRESSINGS) ×2 IMPLANT
CHLORAPREP W/TINT 26 (MISCELLANEOUS) ×2 IMPLANT
CLAMP CORD UMBIL (MISCELLANEOUS) ×2 IMPLANT
CLOSURE STERI STRIP 1/2 X4 (GAUZE/BANDAGES/DRESSINGS) ×2 IMPLANT
CLOTH BEACON ORANGE TIMEOUT ST (SAFETY) ×2 IMPLANT
DRSG OPSITE POSTOP 4X10 (GAUZE/BANDAGES/DRESSINGS) ×2 IMPLANT
ELECT REM PT RETURN 9FT ADLT (ELECTROSURGICAL) ×2
ELECTRODE REM PT RTRN 9FT ADLT (ELECTROSURGICAL) ×1 IMPLANT
EXTRACTOR VACUUM KIWI (MISCELLANEOUS) IMPLANT
EXTRACTOR VACUUM M CUP 4 TUBE (SUCTIONS) IMPLANT
GLOVE BIO SURGEON STRL SZ7.5 (GLOVE) ×2 IMPLANT
GLOVE BIOGEL PI IND STRL 7.0 (GLOVE) ×2 IMPLANT
GLOVE BIOGEL PI INDICATOR 7.0 (GLOVE) ×2
GOWN STRL REUS W/TWL 2XL LVL3 (GOWN DISPOSABLE) ×2 IMPLANT
GOWN STRL REUS W/TWL LRG LVL3 (GOWN DISPOSABLE) ×4 IMPLANT
HEMOSTAT ARISTA ABSORB 3G PWDR (HEMOSTASIS) IMPLANT
HOVERMATT SINGLE USE (MISCELLANEOUS) ×2 IMPLANT
KIT ABG SYR 3ML LUER SLIP (SYRINGE) IMPLANT
NEEDLE HYPO 25X5/8 SAFETYGLIDE (NEEDLE) IMPLANT
NS IRRIG 1000ML POUR BTL (IV SOLUTION) ×2 IMPLANT
PACK C SECTION WH (CUSTOM PROCEDURE TRAY) ×2 IMPLANT
PAD OB MATERNITY 4.3X12.25 (PERSONAL CARE ITEMS) ×2 IMPLANT
RETRACTOR TRAXI PANNICULUS (MISCELLANEOUS) ×1 IMPLANT
RTRCTR C-SECT PINK 25CM LRG (MISCELLANEOUS) ×2 IMPLANT
STRIP CLOSURE SKIN 1/2X4 (GAUZE/BANDAGES/DRESSINGS) IMPLANT
SUT PDS AB 0 CTX 36 PDP370T (SUTURE) IMPLANT
SUT PLAIN 0 NONE (SUTURE) IMPLANT
SUT PLAIN 2 0 (SUTURE)
SUT PLAIN ABS 2-0 CT1 27XMFL (SUTURE) IMPLANT
SUT VIC AB 0 CT1 36 (SUTURE) ×10 IMPLANT
SUT VIC AB 2-0 CT1 27 (SUTURE) ×2
SUT VIC AB 2-0 CT1 TAPERPNT 27 (SUTURE) ×2 IMPLANT
SUT VIC AB 4-0 KS 27 (SUTURE) ×2 IMPLANT
TOWEL OR 17X24 6PK STRL BLUE (TOWEL DISPOSABLE) ×4 IMPLANT
TRAXI PANNICULUS RETRACTOR (MISCELLANEOUS) ×1
TRAY FOLEY W/BAG SLVR 14FR LF (SET/KITS/TRAYS/PACK) IMPLANT
WATER STERILE IRR 1000ML POUR (IV SOLUTION) ×2 IMPLANT

## 2020-03-15 NOTE — Anesthesia Postprocedure Evaluation (Signed)
Anesthesia Post Note  Patient: Gina Alvarado  Procedure(s) Performed: CESAREAN SECTION     Patient location during evaluation: PACU Anesthesia Type: Spinal Level of consciousness: awake and alert and oriented Pain management: pain level controlled Vital Signs Assessment: post-procedure vital signs reviewed and stable Respiratory status: spontaneous breathing, nonlabored ventilation and respiratory function stable Cardiovascular status: blood pressure returned to baseline and stable Postop Assessment: no headache, no backache, spinal receding, patient able to bend at knees and no apparent nausea or vomiting Anesthetic complications: no   No complications documented.  Last Vitals:  Vitals:   03/15/20 2133 03/15/20 2145  BP: 129/78 124/82  Pulse: (!) 105 90  Resp: (!) 29 (!) 25  Temp: 36.9 C   SpO2: 100% 100%    Last Pain:  Vitals:   03/15/20 2145  TempSrc:   PainSc: 0-No pain   Pain Goal:    LLE Motor Response: Purposeful movement (03/15/20 2145) LLE Sensation: Tingling (03/15/20 2145) RLE Motor Response: Purposeful movement (03/15/20 2145) RLE Sensation: Tingling (03/15/20 2145)     Epidural/Spinal Function Cutaneous sensation: Tingles (03/15/20 2145), Patient able to flex knees: Yes (03/15/20 2145), Patient able to lift hips off bed: No (03/15/20 2145), Back pain beyond tenderness at insertion site: No (03/15/20 2145), Progressively worsening motor and/or sensory loss: No (03/15/20 2145), Bowel and/or bladder incontinence post epidural: No (03/15/20 2145)  Lannie Fields
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
## 2020-03-15 NOTE — Discharge Summary (Signed)
Postpartum Discharge Summary    Patient Name: Gina Alvarado DOB: February 18, 1991 MRN: [id number]  Date of admission: 03/15/2020 Delivery date:03/15/2020  Delivering provider: Janet Berlin  Date of discharge: 03/17/2020  Admitting diagnosis: History of DVT (deep vein thrombosis) [Z86.718] Intrauterine pregnancy: [redacted]w[redacted]d    Secondary diagnosis:  Active Problems:   WPW (Wolff-Parkinson-White syndrome)   History of DVT (deep vein thrombosis)   Obesity affecting pregnancy   H/O: C-section   Cesarean delivery delivered  Additional problems: none    Discharge diagnosis: Term Pregnancy Delivered                                              Post partum procedures:none Augmentation: Pitocin Complications: None  Hospital course: Induction of Labor With Cesarean Section   29y.o. yo [id number] admitted to the hospital 03/15/2020 for induction of labor. Patient had a labor course significant for being started on pitocin, and after a few hours deciding she would prefer to have a repeat LTCS. The patient went for cesarean section due to Elective Repeat. Delivery details are as follows: Membrane Rupture Time/Date: 7:46 PM ,03/15/2020   Delivery Method:C-Section, Low Transverse  Details of operation can be found in separate operative Note.  Patient had an uncomplicated postpartum course. She is ambulating, tolerating a regular diet, passing flatus, and urinating well.  Patient is discharged home in stable condition on 03/17/20.      Newborn Data: Birth date:03/15/2020  Birth time:7:47 PM  Gender:Female  Living status:Living  Apgars:8 ,9  Weight:6 lb 10.2 oz (3.01 kg)                                 Magnesium Sulfate received: No BMZ received: No Rhophylac:No MMR:N/A T-DaP:Given prenatally Flu: Yes Transfusion:No  Physical exam  Vitals:   03/16/20 0900 03/16/20 1300 03/16/20 2159 03/17/20 0523  BP: 106/66 116/70 131/70 124/77  Pulse: 90 92    Resp: _0 Temp: 98.5 F  (36.9 C) 98.6 F (37 C) 98.6 F (37 C) 99.6 F (37.6 C)  TempSrc:   Oral Oral  SpO2: 99% 100% 100% 100%   General: alert, cooperative and no distress Lochia: appropriate Uterine Fundus: firm Incision: Healing well with no significant drainage, No significant erythema, Dressing is clean, dry, and intact DVT Evaluation: No evidence of DVT seen on physical exam. Labs: Lab Results  Component Value Date   WBC 13.7 (H) 03/16/2020   HGB 8.3 (L) 03/16/2020   HCT 26.0 (L) 03/16/2020   MCV 91.2 03/16/2020   PLT 293 03/16/2020   CMP Latest Ref Rng & Units 03/15/2020  Glucose 65 - 99 mg/dL -  BUN 6 - 20 mg/dL -  Creatinine 0.44 - 1.00 mg/dL 0.69  Sodium 134 - 144 mmol/L -  Potassium 3.5 - 5.2 mmol/L -  Chloride 96 - 106 mmol/L -  CO2 20 - 29 mmol/L -  Calcium 8.7 - 10.2 mg/dL -  Total Protein 6.0 - 8.5 g/dL -  Total Bilirubin 0.0 - 1.2 mg/dL -  Alkaline Phos 48 - 121 IU/L -  AST 0 - 40 IU/L -  ALT 0 - 32 IU/L -   Edinburgh Score: Edinburgh Postnatal Depression Scale Screening Tool 03/17/2020  I have been able to laugh and see the  funny side of things. 0  I have looked forward with enjoyment to things. 0  I have blamed myself unnecessarily when things went wrong. 0  I have been anxious or worried for no good reason. 0  I have felt scared or panicky for no good reason. 0  Things have been getting on top of me. 0  I have been so unhappy that I have had difficulty sleeping. 0  I have felt sad or miserable. 0  I have been so unhappy that I have been crying. 0  The thought of harming myself has occurred to me. 0  Edinburgh Postnatal Depression Scale Total 0     After visit meds:  Allergies as of 03/17/2020      Reactions   Other Anaphylaxis   seafood      Medication List    TAKE these medications   acetaminophen 500 MG tablet Commonly known as: TYLENOL Take 1 tablet (500 mg total) by mouth every 6 (six) hours as needed. What changed: reasons to take this   Blood  Pressure Kit Devi 1 Device by Does not apply route daily.   enoxaparin 40 MG/0.4ML injection Commonly known as: LOVENOX Inject 0.4 mLs (40 mg total) into the skin daily.   ibuprofen 800 MG tablet Commonly known as: ADVIL Take 1 tablet (800 mg total) by mouth every 6 (six) hours.   oxyCODONE 5 MG immediate release tablet Commonly known as: Oxy IR/ROXICODONE Take 1 tablet (5 mg total) by mouth every 4 (four) hours as needed for up to 10 doses for moderate pain.   prenatal multivitamin Tabs tablet Take 1 tablet by mouth daily at 12 noon.      Discharge home in stable condition Infant Feeding: Breast Infant Disposition:home with mother Discharge instruction: per After Visit Summary and Postpartum booklet. Activity: Advance as tolerated. Pelvic rest for 6 weeks.  Diet: routine diet Future Appointments: Future Appointments  Date Time Provider Foraker  04/14/2020  3:55 PM Griffin Basil, MD [facility]   Follow up Visit:  [facility] [place] [facility]. Go in 1 week(s).   Specialty: Obstetrics and Gynecology Why: for incision check and 4wks for postpartum follow up Contact information: [address] [place] [address] [phone]               Please schedule this patient for a In person postpartum visit in 4 weeks with the following provider: MD. Additional Postpartum F/U:Incision check 1 week  High risk pregnancy complicated by: hx of DVT, Wolf Parkinson White Syndrome, repeat LTCS  Delivery mode:  C-Section, Low Transverse  Anticipated Birth Control:  Depo  Gaylan Gerold, CNM, MSN, [facility] 03/17/20 11:02 AM

## 2020-03-15 NOTE — Anesthesia Procedure Notes (Signed)
Spinal  Patient location during procedure: OR Start time: 03/15/2020 7:16 PM End time: 03/15/2020 7:19 PM Staffing Performed: anesthesiologist  Anesthesiologist: Lannie Fields, DO Preanesthetic Checklist Completed: patient identified, IV checked, site marked, risks and benefits discussed, surgical consent, monitors and equipment checked, pre-op evaluation and timeout performed Spinal Block Patient position: sitting Prep: DuraPrep and site prepped and draped Patient monitoring: heart rate, cardiac monitor, continuous pulse ox and blood pressure Approach: midline Location: L3-4 Injection technique: single-shot Needle Needle type: Sprotte  Needle gauge: 24 G Needle length: 9 cm Assessment Sensory level: T4 Additional Notes Functioning IV was confirmed and monitors were applied. Sterile prep and drape, including hand hygiene and sterile gloves were used. The patient was positioned and the spine was prepped. The skin was anesthetized with lidocaine.  Free flow of clear CSF was obtained prior to injecting local anesthetic into the CSF.  The spinal needle aspirated freely following injection.  The needle was carefully withdrawn.  The patient tolerated the procedure well.

## 2020-03-15 NOTE — Transfer of Care (Signed)
Immediate Anesthesia Transfer of Care Note  Patient: Gina Alvarado  Procedure(s) Performed: CESAREAN SECTION  Patient Location: PACU  Anesthesia Type:Spinal  Level of Consciousness: awake  Airway & Oxygen Therapy: Patient Spontanous Breathing  Post-op Assessment: Report given to RN and Post -op Vital signs reviewed and stable  Post vital signs: Reviewed and stable  Last Vitals:  Vitals Value Taken Time  BP 134/56 03/15/20 2055  Temp    Pulse 93 03/15/20 2057  Resp 20 03/15/20 2057  SpO2 100 % 03/15/20 2057  Vitals shown include unvalidated device data.  Last Pain:  Vitals:   03/15/20 1500  TempSrc:   PainSc: 4          Complications: No complications documented.

## 2020-03-15 NOTE — Op Note (Signed)
Operative Note   SURGERY DATE: 03/15/2020  PRE-OP DIAGNOSIS:  *Pregnancy at [redacted]w[redacted]d *Repeat LTCS   POST-OP DIAGNOSIS: same    PROCEDURE: repeat low transverse cesarean section via pfannenstiel skin incision with double layer uterine closure  SURGEON: Surgeon(s) and Role:    * Malachy Chamber, MD - Primary    * Gita Kudo, MD - Fellow  ANESTHESIA: spinal  ESTIMATED BLOOD LOSS: ~ 600  DRAINS: 82mL UOP via indwelling foley  TOTAL IV FLUIDS: crystalloid  VTE PROPHYLAXIS: SCDs to bilateral lower extremities  ANTIBIOTICS: Two grams of Cefazolin were given., within 1 hour of skin incision  SPECIMENS: placenta to L&D   COMPLICATIONS: none  INDICATIONS: elective repeat LTCS  FINDINGS: Mild intra-abdominal adhesions were noted. Grossly normal uterus, tubes and ovaries. clear amniotic fluid, cephalic female infant, weight pending, APGARs 8/9, intact placenta.  PROCEDURE IN DETAIL: The patient was taken to the operating room where anesthesia was administered and normal fetal heart tones were confirmed. She was then prepped and draped in the normal fashion in the dorsal supine position with a leftward tilt.  After a time out was performed, a pfannensteil skin incision was made with the scalpel and carried through to the underlying layer of fascia. The fascia was then incised at the midline and this incision was extended laterally, bluntly and with mayo scissors Attention was turned to the superior aspect of the fascial incision which was grasped with the kocher clamps x 2, tented up and the rectus muscles were dissected off with the scalpel. The rectus muscles were then separated in the midline and the peritoneum was entered bluntly.    A low transverse hysterotomy was made with the scalpel until the endometrial cavity was breached and the amniotic sac ruptured with the Allis clamp, yielding clear amniotic fluid. This incision was extended bluntly and the infant's head,  shoulders and body were delivered atraumatically.The cord was clamped x 2 and cut, and the infant was handed to the awaiting pediatricians, after delayed cord clamping was done. The placenta was then gradually expressed from the uterus and then the uterus was cleared of all clots and debris. The hysterotomy was repaired with a running suture of 1-0 vicryl. A second imbricating layer of 1-0 vicryl suture was then placed. Several figure-of-eight sutures of 1-0 vicryl were added to achieve excellent hemostasis.   The hysterotomy and all operative sites were reinspected and excellent hemostasis was observed.   The fascia was reapproximated with 0 Vicryl in a simple running fashion bilaterally. The subcutaneous layer was then reapproximated with interrupted sutures of 2-0 plain gut, and the skin was then closed with 4-0 vicryl on a keith's needle.   The patient  tolerated the procedure well. Sponge, lap, needle, and instrument counts were correct x 2. The patient was transferred to the recovery room awake, alert and breathing independently in stable condition.   Casper Harrison, MD [facility] Family Medicine Fellow, [facility] for [facility], [facility]

## 2020-03-15 NOTE — Anesthesia Preprocedure Evaluation (Signed)
Anesthesia Evaluation  Patient identified by MRN, date of birth, ID band Patient awake    Reviewed: Allergy & Precautions, NPO status , Patient's Chart, lab work & pertinent test results  Airway Mallampati: II  TM Distance: >3 FB Neck ROM: Full    Dental no notable dental hx.    Pulmonary Current Smoker,    Pulmonary exam normal breath sounds clear to auscultation       Cardiovascular + DVT (LLE 2018- onlovenox, last dose 03/13/20)  Normal cardiovascular exam+ dysrhythmias (WPW)  Rhythm:Regular Rate:Normal  Hx WPW- was recommended for ablation but failed to follow up   Neuro/Psych Seizures - (hx epilepsy last seizure 2019),  PSYCHIATRIC DISORDERS Pt stopped taking seizure meds because she didn't like the way they made her feel    GI/Hepatic negative GI ROS, (+)     substance abuse (last use 2018)  ,   Endo/Other  Morbid obesityBMI 46  Renal/GU negative Renal ROS  negative genitourinary   Musculoskeletal negative musculoskeletal ROS (+)   Abdominal (+) + obese,   Peds negative pediatric ROS (+)  Hematology  (+) Blood dyscrasia, anemia , hct 31.2, plt 373   Anesthesia Other Findings   Reproductive/Obstetrics (+) Pregnancy 1 prior c section after laboring. Elected to VBAC this pregnancy but has now decided that she does not want to proceed with laboring, electing for c section- has not yet requested neuraxial                             Anesthesia Physical Anesthesia Plan  ASA: III and emergent  Anesthesia Plan: Spinal   Post-op Pain Management:    Induction:   PONV Risk Score and Plan: 2 and Propofol infusion and TIVA  Airway Management Planned: Natural Airway and Nasal Cannula  Additional Equipment: None  Intra-op Plan:   Post-operative Plan:   Informed Consent: I have reviewed the patients History and Physical, chart, labs and discussed the procedure including the risks,  benefits and alternatives for the proposed anesthesia with the patient or authorized representative who has indicated his/her understanding and acceptance.       Plan Discussed with: CRNA  Anesthesia Plan Comments:         Anesthesia Quick Evaluation
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
## 2020-03-16 ENCOUNTER — Encounter (HOSPITAL_COMMUNITY): Payer: Self-pay | Admitting: Obstetrics & Gynecology

## 2020-03-16 LAB — CBC
HCT: 26 % — ABNORMAL LOW (ref 36.0–46.0)
Hemoglobin: 8.3 g/dL — ABNORMAL LOW (ref 12.0–15.0)
MCH: 29.1 pg (ref 26.0–34.0)
MCHC: 31.9 g/dL (ref 30.0–36.0)
MCV: 91.2 fL (ref 80.0–100.0)
Platelets: 293 10*3/uL (ref 150–400)
RBC: 2.85 MIL/uL — ABNORMAL LOW (ref 3.87–5.11)
RDW: 15.9 % — ABNORMAL HIGH (ref 11.5–15.5)
WBC: 13.7 10*3/uL — ABNORMAL HIGH (ref 4.0–10.5)
nRBC: 0.3 % — ABNORMAL HIGH (ref 0.0–0.2)

## 2020-03-16 MED ORDER — HYDROXYZINE HCL 25 MG PO TABS
25.0000 mg | ORAL_TABLET | Freq: Once | ORAL | Status: AC
  Administered 2020-03-16: 25 mg via ORAL
  Filled 2020-03-16: qty 1

## 2020-03-16 MED ORDER — FERROUS SULFATE 325 (65 FE) MG PO TABS
325.0000 mg | ORAL_TABLET | Freq: Every day | ORAL | Status: DC
  Filled 2020-03-16: qty 1

## 2020-03-16 NOTE — Progress Notes (Addendum)
Subjective: Postpartum Day 1: Cesarean Delivery Patient reports tolerating PO, + flatus, + BM and no problems voiding.    Objective: Vital signs in last 24 hours: Temp:  [97.9 F (36.6 C)-99.1 F (37.3 C)] 98.6 F (37 C) (09/26 1300) Pulse Rate:  [88-105] 92 (09/26 1300) Resp:  [16-29] 18 (09/26 1300) BP: (106-148)/(66-88) 116/70 (09/26 1300) SpO2:  [97 %-100 %] 100 % (09/26 1300)  Physical Exam:  General: alert, cooperative and no distress Lochia: appropriate Uterine Fundus: firm Incision: healing well, no significant erythema DVT Evaluation: No evidence of DVT seen on physical exam. No significant calf/ankle edema.  Recent Labs    03/15/20 0911 03/16/20 0440  HGB 10.1* 8.3*  HCT 31.2* 26.0*    Assessment/Plan: 29 y/o G3P2012 POD#1 from rLTCS - Patient doing well post-operatively  - Pain well controlled - Incision site intact, no sign of active bleeding  - Hgb 10.1>8.3 this AM. Patient asymptomatic. Will start PO iron.   Hx DVT - in 2018. Patient denies workup done for this but believes it may be hereditary as her mother and grandmother have hx blood clots.  - On Lovenox for VTE prophylaxis - Plan to d/c with prophylaxis for 6 weeks.   Hx WPW - Asymptomatic - Recommended to have ablation in past, patient denies following up. - Will need to follow up with cardiology post-partum  Contraception: Depo prior to d/c Dispo: Plan for d/c in 1-2 days.  Sabino Dick 03/16/2020, 2:57 PM   I was present for the exam and agree with above. Consulted pharmacy RE: Lovenox dosing. Per RCOG they recommend 60 mg QD x 6 weeks. Needs Heme work-up.   Katrinka Blazing, [place], [place] 03/16/2020 7:39 PM

## 2020-03-16 NOTE — Lactation Note (Addendum)
This note was copied from a Gina's chart. Lactation Consultation Note  Patient Name: Gina Alvarado [id number] Date: 03/16/2020   Gina Alvarado now 76 hours old. First consult for LC at 75 hours old. Did not ask about breastfeeding hx.   Mom reports Gina Alvarado was not getting enough with breastfeeding. Mom reports so she started formula.  Mom reports she has a DEBP at home.   Mom reports her original goal was to breastfeed. Inquired about pumping.  Mom said she would like to pump. Mom stated she  Did not know there was a breastpump in her room.  Took pump kit and accessories for pumping. Mom reports she does not want to start right now.  Reviewed with mom how to use the DEBP and the manual pump.   Urged mom to offer the breast first then pump and feed back pumped breastmilk and/or formula as a supplement as needed.Gina Alvarado mom to pump everytime infant gets a bottle Moms other child is 82 years old.  Reviewed new CDC powdered infant formula prep guidelines.Urged mom to call lactation as needed.     Maternal Data    Feeding Feeding Type: Bottle Fed - Formula Nipple Type: Slow - flow  LATCH Score                   Interventions    Lactation Tools Discussed/Used     Consult Status      Gina Alvarado 03/16/2020, 9:51 PM
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
## 2020-03-17 ENCOUNTER — Encounter (HOSPITAL_COMMUNITY): Payer: Self-pay | Admitting: Obstetrics & Gynecology

## 2020-03-17 MED ORDER — IBUPROFEN 800 MG PO TABS: 800.0000 mg | ORAL_TABLET | Freq: Four times a day (QID) | ORAL | 0 refills | Status: AC

## 2020-03-17 MED ORDER — OXYCODONE HCL 5 MG PO TABS
5.0000 mg | ORAL_TABLET | ORAL | 0 refills | Status: AC | PRN
Start: 2020-03-17 — End: ?
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
## 2020-03-17 NOTE — Discharge Instructions (Signed)
Cesarean Delivery, Care After This sheet gives you information about how to care for yourself after your procedure. Your health care provider may also give you more specific instructions. If you have problems or questions, contact your health care provider. What can I expect after the procedure? After the procedure, it is common to have:  A small amount of blood or clear fluid coming from the incision.  Some redness, swelling, and pain in your incision area.  Some abdominal pain and soreness.  Vaginal bleeding (lochia). Even though you did not have a vaginal delivery, you will still have vaginal bleeding and discharge.  Pelvic cramps.  Fatigue. You may have pain, swelling, and discomfort in the tissue between your vagina and your anus (perineum) if:  Your C-section was unplanned, and you were allowed to labor and push.  An incision was made in the area (episiotomy) or the tissue tore during attempted vaginal delivery. Follow these instructions at home: Incision care   Follow instructions from your health care provider about how to take care of your incision. Make sure you: ? Wash your hands with soap and water before you change your bandage (dressing). If soap and water are not available, use hand sanitizer. ? If you have a dressing, change it or remove it as told by your health care provider. ? Leave stitches (sutures), skin staples, skin glue, or adhesive strips in place. These skin closures may need to stay in place for 2 weeks or longer. If adhesive strip edges start to loosen and curl up, you may trim the loose edges. Do not remove adhesive strips completely unless your health care provider tells you to do that.  Check your incision area every day for signs of infection. Check for: ? More redness, swelling, or pain. ? More fluid or blood. ? Warmth. ? Pus or a bad smell.  Do not take baths, swim, or use a hot tub until your health care provider says it's okay. Ask your health  care provider if you can take showers.  When you cough or sneeze, hug a pillow. This helps with pain and decreases the chance of your incision opening up (dehiscing). Do this until your incision heals. Medicines  Take over-the-counter and prescription medicines only as told by your health care provider.  If you were prescribed an antibiotic medicine, take it as told by your health care provider. Do not stop taking the antibiotic even if you start to feel better.  Do not drive or use heavy machinery while taking prescription pain medicine. Lifestyle  Do not drink alcohol. This is especially important if you are breastfeeding or taking pain medicine.  Do not use any products that contain nicotine or tobacco, such as cigarettes, e-cigarettes, and chewing tobacco. If you need help quitting, ask your health care provider. Eating and drinking  Drink at least 8 eight-ounce glasses of water every day unless told not to by your health care provider. If you breastfeed, you may need to drink even more water.  Eat high-fiber foods every day. These foods may help prevent or relieve constipation. High-fiber foods include: ? Whole grain cereals and breads. ? Brown rice. ? Beans. ? Fresh fruits and vegetables. Activity   If possible, have someone help you care for your baby and help with household activities for at least a few days after you leave the hospital.  Return to your normal activities as told by your health care provider. Ask your health care provider what activities are safe for  you.  Rest as much as possible. Try to rest or take a nap while your baby is sleeping.  Do not lift anything that is heavier than 10 lbs (4.5 kg), or the limit that you were told, until your health care provider says that it is safe.  Talk with your health care provider about when you can engage in sexual activity. This may depend on your: ? Risk of infection. ? How fast you heal. ? Comfort and desire to  engage in sexual activity. General instructions  Do not use tampons or douches until your health care provider approves.  Wear loose, comfortable clothing and a supportive and well-fitting bra.  Keep your perineum clean and dry. Wipe from front to back when you use the toilet.  If you pass a blood clot, save it and call your health care provider to discuss. Do not flush blood clots down the toilet before you get instructions from your health care provider.  Keep all follow-up visits for you and your baby as told by your health care provider. This is important. Contact a health care provider if:  You have: ? A fever. ? Bad-smelling vaginal discharge. ? Pus or a bad smell coming from your incision. ? Difficulty or pain when urinating. ? A sudden increase or decrease in the frequency of your bowel movements. ? More redness, swelling, or pain around your incision. ? More fluid or blood coming from your incision. ? A rash. ? Nausea. ? Little or no interest in activities you used to enjoy. ? Questions about caring for yourself or your baby.  Your incision feels warm to the touch.  Your breasts turn red or become painful or hard.  You feel unusually sad or worried.  You vomit.  You pass a blood clot from your vagina.  You urinate more than usual.  You are dizzy or light-headed. Get help right away if:  You have: ? Pain that does not go away or get better with medicine. ? Chest pain. ? Difficulty breathing. ? Blurred vision or spots in your vision. ? Thoughts about hurting yourself or your baby. ? New pain in your abdomen or in one of your legs. ? A severe headache.  You faint.  You bleed from your vagina so much that you fill more than one sanitary pad in one hour. Bleeding should not be heavier than your heaviest period. Summary  After the procedure, it is common to have pain at your incision site, abdominal cramping, and slight bleeding from your vagina.  Check  your incision area every day for signs of infection.  Tell your health care provider about any unusual symptoms.  Keep all follow-up visits for you and your baby as told by your health care provider. This information is not intended to replace advice given to you by your health care provider. Make sure you discuss any questions you have with your health care provider. Document Revised: 12/14/2017 Document Reviewed: 12/14/2017 Elsevier Patient Education  2020 Elsevier Inc.    Postpartum Baby Blues The postpartum period begins right after the birth of a baby. During this time, there is often a lot of joy and excitement. It is also a time of many changes in the life of the parents. No matter how many times a mother gives birth, each child brings new challenges to the family, including different ways of relating to one another. It is common to have feelings of excitement along with confusing changes in moods, emotions, and thoughts.   may feel happy one minute and sad or stressed the next. These feelings of sadness usually happen in the period right after you have your baby, and they go away within a week or two. This is called the "baby blues." What are the causes? There is no known cause of baby blues. It is likely caused by a combination of factors. However, changes in hormone levels after childbirth are believed to trigger some of the symptoms. Other factors that can play a role in these mood changes include:  Lack of sleep.  Stressful life events, such as poverty, caring for a loved one, or death of a loved one.  Genetics. What are the signs or symptoms? Symptoms of this condition include:  Brief changes in mood, such as going from extreme happiness to sadness.  Decreased concentration.  Difficulty sleeping.  Crying spells and tearfulness.  Loss of appetite.  Irritability.  Anxiety. If the symptoms of baby blues last for more than 2 weeks or become more severe, you may have  postpartum depression. How is this diagnosed? This condition is diagnosed based on an evaluation of your symptoms. There are no medical or lab tests that lead to a diagnosis, but there are various questionnaires that a health care provider may use to identify women with the baby blues or postpartum depression. How is this treated? Treatment is not needed for this condition. The baby blues usually go away on their own in 1-2 weeks. Social support is often all that is needed. You will be encouraged to get adequate sleep and rest. Follow these instructions at home: Lifestyle      Get as much rest as you can. Take a nap when the baby sleeps.  Exercise regularly as told by your health care provider. Some women find yoga and walking to be helpful.  Eat a balanced and nourishing diet. This includes plenty of fruits and vegetables, whole grains, and lean proteins.  Do little things that you enjoy. Have a cup of tea, take a bubble bath, read your favorite magazine, or listen to your favorite music.  Avoid alcohol.  Ask for help with household chores, cooking, grocery shopping, or running errands. Do not try to do everything yourself. Consider hiring a postpartum doula to help. This is a professional who specializes in providing support to new mothers.  Try not to make any major life changes during pregnancy or right after giving birth. This can add stress. General instructions  Talk to people close to you about how you are feeling. Get support from your partner, family members, friends, or other new moms. You may want to join a support group.  Find ways to cope with stress. This may include: ? Writing your thoughts and feelings in a journal. ? Spending time outside. ? Spending time with people who make you laugh.  Try to stay positive in how you think. Think about the things you are grateful for.  Take over-the-counter and prescription medicines only as told by your health care  provider.  Let your health care provider know if you have any concerns.  Keep all postpartum visits as told by your health care provider. This is important. Contact a health care provider if:  Your baby blues do not go away after 2 weeks. Get help right away if:  You have thoughts of taking your own life (suicidal thoughts).  You think you may harm the baby or other people.  You see or hear things that are not there (hallucinations). Summary  After giving birth, you may feel happy one minute and sad or stressed the next. Feelings of sadness that happen right after the baby is born and go away after a week or two are called the "baby blues."  You can manage the baby blues by getting enough rest, eating a healthy diet, exercising, spending time with supportive people, and finding ways to cope with stress.  If feelings of sadness and stress last longer than 2 weeks or get in the way of caring for your baby, talk to your health care provider. This may mean you have postpartum depression. This information is not intended to replace advice given to you by your health care provider. Make sure you discuss any questions you have with your health care provider. Document Revised: 09/29/2018 Document Reviewed: 08/03/2016 Elsevier Patient Education  2020 [employer].

## 2020-03-20 ENCOUNTER — Encounter: Payer: Medicaid Other | Admitting: Obstetrics and Gynecology

## 2020-04-14 ENCOUNTER — Ambulatory Visit: Payer: Medicaid Other | Admitting: Obstetrics and Gynecology

## 2020-04-14 ENCOUNTER — Ambulatory Visit: Payer: Medicaid Other

## 2020-08-19 ENCOUNTER — Ambulatory Visit: Payer: Medicaid Other | Admitting: Obstetrics and Gynecology

## 2020-11-12 ENCOUNTER — Encounter (HOSPITAL_COMMUNITY): Payer: Self-pay | Admitting: Emergency Medicine

## 2020-11-12 ENCOUNTER — Emergency Department (HOSPITAL_COMMUNITY): Payer: Medicaid Other

## 2020-11-12 ENCOUNTER — Emergency Department (HOSPITAL_COMMUNITY)
Admission: EM | Admit: 2020-11-12 | Discharge: 2020-11-12 | Disposition: A | Discharge status: Home / Self Care | Payer: Medicaid Other | Attending: Emergency Medicine | Admitting: Emergency Medicine

## 2020-11-12 ENCOUNTER — Other Ambulatory Visit: Payer: Self-pay

## 2020-11-12 DIAGNOSIS — R42 Dizziness and giddiness: Secondary | ICD-10-CM | POA: Diagnosis not present

## 2020-11-12 DIAGNOSIS — R0789 Other chest pain: Secondary | ICD-10-CM | POA: Diagnosis not present

## 2020-11-12 DIAGNOSIS — R002 Palpitations: Secondary | ICD-10-CM | POA: Insufficient documentation

## 2020-11-12 DIAGNOSIS — Z8679 Personal history of other diseases of the circulatory system: Secondary | ICD-10-CM | POA: Insufficient documentation

## 2020-11-12 DIAGNOSIS — F17210 Nicotine dependence, cigarettes, uncomplicated: Secondary | ICD-10-CM | POA: Insufficient documentation

## 2020-11-12 LAB — BASIC METABOLIC PANEL
Anion gap: 8 (ref 5–15)
BUN: 10 mg/dL (ref 6–20)
CO2: 21 mmol/L — ABNORMAL LOW (ref 22–32)
Calcium: 9.1 mg/dL (ref 8.9–10.3)
Chloride: 108 mmol/L (ref 98–111)
Creatinine, Ser: 1.04 mg/dL — ABNORMAL HIGH (ref 0.44–1.00)
GFR, Estimated: >60 mL/min (ref >60)
Glucose, Bld: 108 mg/dL — ABNORMAL HIGH (ref 70–99)
Potassium: 3.6 mmol/L (ref 3.5–5.1)
Sodium: 137 mmol/L (ref 135–145)

## 2020-11-12 LAB — CBC
HCT: 40.3 % (ref 36.0–46.0)
Hemoglobin: 13 g/dL (ref 12.0–15.0)
MCH: 29.4 pg (ref 26.0–34.0)
MCHC: 32.3 g/dL (ref 30.0–36.0)
MCV: 91.2 fL (ref 80.0–100.0)
Platelets: 293 10*3/uL (ref 150–400)
RBC: 4.42 MIL/uL (ref 3.87–5.11)
RDW: 13.8 % (ref 11.5–15.5)
WBC: 8.4 10*3/uL (ref 4.0–10.5)
nRBC: 0 % (ref 0.0–0.2)

## 2020-11-12 LAB — I-STAT BETA HCG BLOOD, ED (MC, WL, AP ONLY): I-stat hCG, quantitative: <5 m[IU]/mL (ref <5)

## 2020-11-12 LAB — TROPONIN I (HIGH SENSITIVITY): Troponin I (High Sensitivity): 3 ng/L (ref <18)

## 2020-11-12 NOTE — ED Notes (Signed)
Pt discharged and ambulated out of the ED without difficulty. 

## 2020-11-12 NOTE — Discharge Instructions (Addendum)
Follow up with cardiology for further evaluation of symptoms tonight of palpitations in the setting of your history with Wolff-Parkinson White syndrome.  Return to the ED with any new or concerning symptoms at any time.

## 2020-11-12 NOTE — ED Notes (Signed)
Pt wants to leave AMA. RN informed pt the consequences of leaving AMA. Pt states she needs to go home to her daughter because she is throwing a fit and has never been away from her this long. PA Upstill made aware.

## 2020-11-12 NOTE — ED Provider Notes (Signed)
Emergency Medicine Provider Triage Evaluation Note  Gina Alvarado , a 30 y.o. female  was evaluated in triage.  Pt complains of CP and dizziness.  Has hx of WPW.  Symptoms started just PTA.  Denies any treatments or home meds.  States she feels like she is going to pass out..  Review of Systems  Positive: CP, dizziness Negative: Fever, cough  Physical Exam  BP (!) 140/99 (BP Location: Right Arm)   Pulse 93   Temp 98.8 F (37.1 C) (Oral)   Resp 16   LMP 11/10/2020   SpO2 100%  Gen:   Awake, no distress   Resp:  Normal effort  MSK:   Moves extremities without difficulty  Other:    Medical Decision Making  Medically screening exam initiated at 12:49 AM.  Appropriate orders placed.  Gina Alvarado was informed that the remainder of the evaluation will be completed by another provider, this initial triage assessment does not replace that evaluation, and the importance of remaining in the ED until their evaluation is complete.     Roxy Horseman, PA-C 11/12/20 0050    Marily Memos, MD 11/12/20 0200

## 2020-11-12 NOTE — ED Triage Notes (Signed)
Patient reports central chest pain with palpitations and dizziness this evening , mild SOB , no emesis or diaphoresis .
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
## 2020-11-22 ENCOUNTER — Emergency Department (HOSPITAL_COMMUNITY)
Admission: EM | Admit: 2020-11-22 | Discharge: 2020-11-23 | Disposition: A | Discharge status: Home / Self Care | Payer: Medicaid Other | Attending: Emergency Medicine | Admitting: Emergency Medicine

## 2020-11-22 ENCOUNTER — Other Ambulatory Visit: Payer: Self-pay

## 2020-11-22 DIAGNOSIS — I456 Pre-excitation syndrome: Secondary | ICD-10-CM | POA: Insufficient documentation

## 2020-11-22 DIAGNOSIS — M25551 Pain in right hip: Secondary | ICD-10-CM | POA: Insufficient documentation

## 2020-11-22 DIAGNOSIS — M25512 Pain in left shoulder: Secondary | ICD-10-CM | POA: Diagnosis not present

## 2020-11-22 DIAGNOSIS — M25552 Pain in left hip: Secondary | ICD-10-CM | POA: Diagnosis not present

## 2020-11-22 DIAGNOSIS — G8929 Other chronic pain: Secondary | ICD-10-CM | POA: Diagnosis not present

## 2020-11-22 DIAGNOSIS — F17210 Nicotine dependence, cigarettes, uncomplicated: Secondary | ICD-10-CM | POA: Diagnosis not present

## 2020-11-22 DIAGNOSIS — Z5321 Procedure and treatment not carried out due to patient leaving prior to being seen by health care provider: Secondary | ICD-10-CM | POA: Insufficient documentation

## 2020-11-22 NOTE — ED Notes (Signed)
I called patient to recheck vital signs and no one responded 

## 2020-11-22 NOTE — ED Triage Notes (Signed)
Patient reports right hip pain, left shoulder pain 10/10 since Monday. Says she is in pain daily, has appt to be tested for fibromyalgia but cant be seen until next month.

## 2020-11-22 NOTE — ED Provider Notes (Signed)
Emergency Medicine Provider Triage Evaluation Note  Gina Alvarado , a 30 y.o. female  was evaluated in triage.  Pt complains of right hip pain left shoulder pain for the past few days.  States that she is in pain throughout her entire body daily.  She is scheduled to see her PCP next month to be "tested for fibromyalgia."  States that she has tried all medications that are over-the-counter, muscle relaxer, heating pad ice pack without much improvement.  States that she is having trouble taking care of her child.  Review of Systems  Positive: Myalgias Negative: Numbness, weakness  Physical Exam  BP (!) 138/99 (BP Location: Right Arm)   Pulse (!) 106   Temp 98.8 F (37.1 C) (Oral)   Resp 16   Ht 5\' 4"  (1.626 m)   Wt 99.8 kg   LMP 11/10/2020   SpO2 92%   BMI 37.76 kg/m  Gen:   Awake, no distress   Resp:  Normal effort  MSK:   Moves extremities without difficulty  Other:  Appears uncomfortable  Medical Decision Making  Medically screening exam initiated at 2:00 PM.  Appropriate orders placed.  Valta Dillon was informed that the remainder of the evaluation will be completed by another provider, this initial triage assessment does not replace that evaluation, and the importance of remaining in the ED until their evaluation is complete.  Will likely need symptom control   Forbes Cellar, PA-C 11/22/20 1401    01/22/21, MD 11/23/20 1625

## 2020-11-23 ENCOUNTER — Emergency Department (HOSPITAL_COMMUNITY)
Admission: EM | Admit: 2020-11-23 | Discharge: 2020-11-23 | Disposition: A | Discharge status: Home / Self Care | Payer: Medicaid Other | Source: Home / Self Care | Attending: Emergency Medicine | Admitting: Emergency Medicine

## 2020-11-23 ENCOUNTER — Encounter (HOSPITAL_COMMUNITY): Payer: Self-pay | Admitting: Emergency Medicine

## 2020-11-23 ENCOUNTER — Other Ambulatory Visit: Payer: Self-pay

## 2020-11-23 ENCOUNTER — Emergency Department (HOSPITAL_COMMUNITY): Payer: Medicaid Other

## 2020-11-23 DIAGNOSIS — G8929 Other chronic pain: Secondary | ICD-10-CM | POA: Insufficient documentation

## 2020-11-23 DIAGNOSIS — M25551 Pain in right hip: Secondary | ICD-10-CM

## 2020-11-23 DIAGNOSIS — M25512 Pain in left shoulder: Secondary | ICD-10-CM | POA: Insufficient documentation

## 2020-11-23 DIAGNOSIS — F17210 Nicotine dependence, cigarettes, uncomplicated: Secondary | ICD-10-CM | POA: Insufficient documentation

## 2020-11-23 DIAGNOSIS — I456 Pre-excitation syndrome: Secondary | ICD-10-CM | POA: Insufficient documentation

## 2020-11-23 DIAGNOSIS — M25552 Pain in left hip: Secondary | ICD-10-CM | POA: Insufficient documentation

## 2020-11-23 LAB — POC URINE PREG, ED: Preg Test, Ur: NEGATIVE

## 2020-11-23 MED ORDER — MELOXICAM 7.5 MG PO TABS: 7.5000 mg | ORAL_TABLET | Freq: Every day | ORAL | 0 refills | Status: AC

## 2020-11-23 MED ORDER — KETOROLAC TROMETHAMINE 30 MG/ML IJ SOLN
30.0000 mg | Freq: Once | INTRAMUSCULAR | Status: AC
  Administered 2020-11-23: 30 mg via INTRAMUSCULAR
  Filled 2020-11-23: qty 1

## 2020-11-23 NOTE — ED Notes (Signed)
In hall 15, no distress, pt scrolling through phone, states she cannot use her upper left arm or shoulder also c/o right hip pain, states she is being evaluated for fibromyalgia but that she cannot be seen until next month. States her boyfriend drove her here.
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
## 2020-11-23 NOTE — Discharge Instructions (Addendum)
Take meloxicam as prescribed for pain.  Do not take other NSAID medications such as Aleve, ibuprofen, Motrin. Warm compresses or soaks for sore joints. Follow-up with your doctor next month as scheduled as discussed.  Suggest rheumatologic work-up or evaluation for multiple joint pain.

## 2020-11-23 NOTE — ED Triage Notes (Signed)
Pt reports R hip pain and L shoulder pain that has been worse since Monday with L arm weakness/decreased mobility.  States she has an appt to be tested for fibromyalgia next month.  Seen at [facility] yesterday for same.

## 2021-07-04 IMAGING — US US OB LIMITED
1 series · 14 of 28 positions shown · non-contrast
Comparison: none

CLINICAL DATA: Abdominal pain

EXAM:
LIMITED OBSTETRIC ULTRASOUND

[Series 1: us ob limited · 14 of 39 slices shown]
[im 2/39]
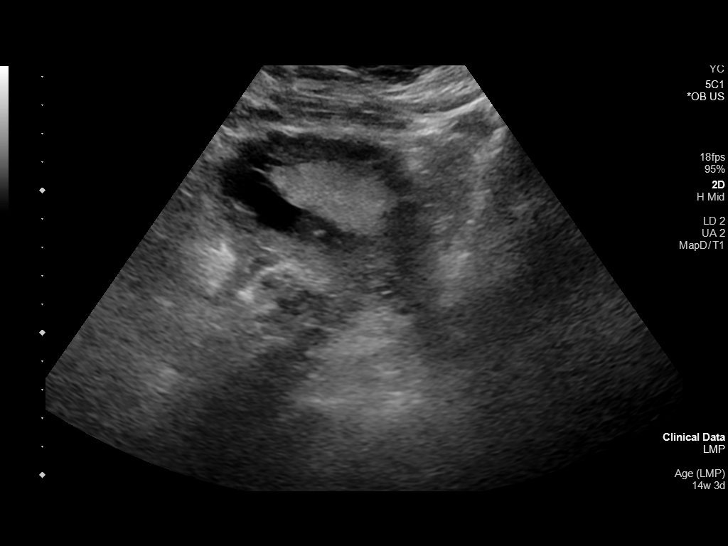
[im 5/39]
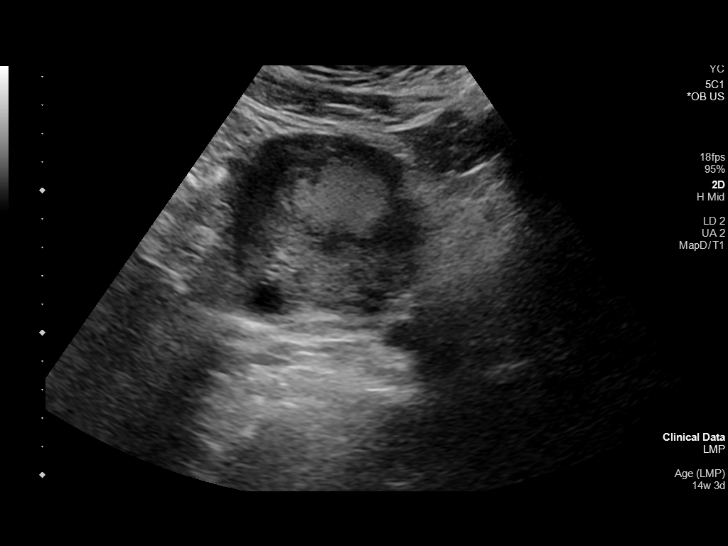
[im 8/39]
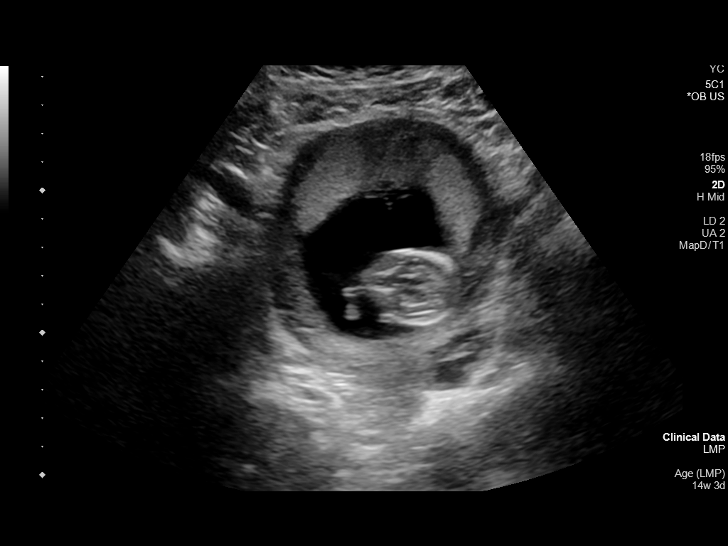
[im 10/39]
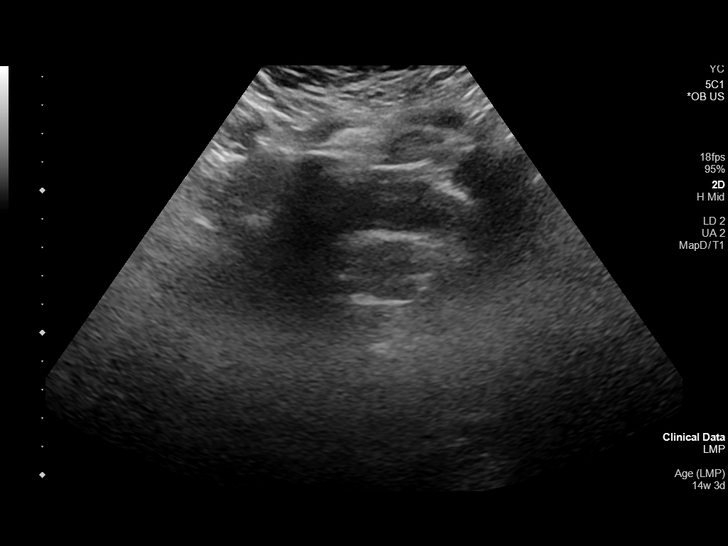
[im 13/39]
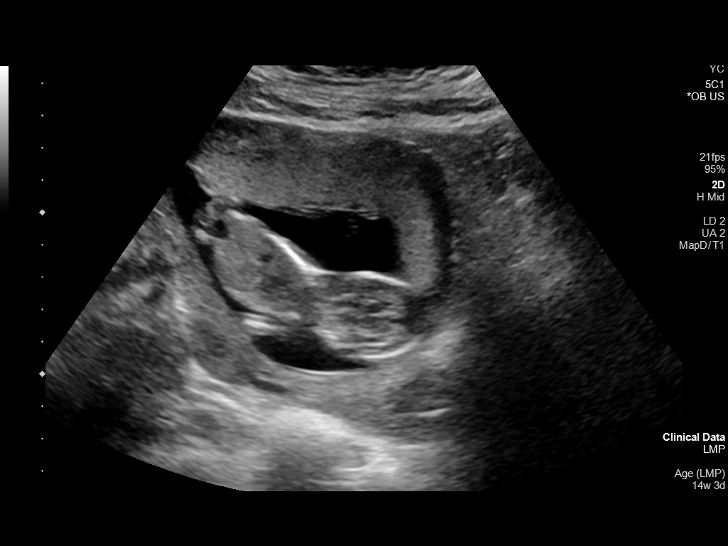
[im 16/39]
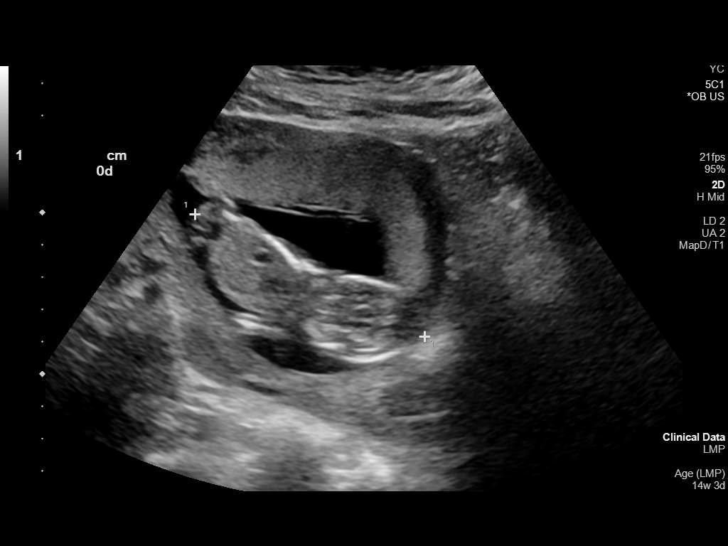
[im 19/39]
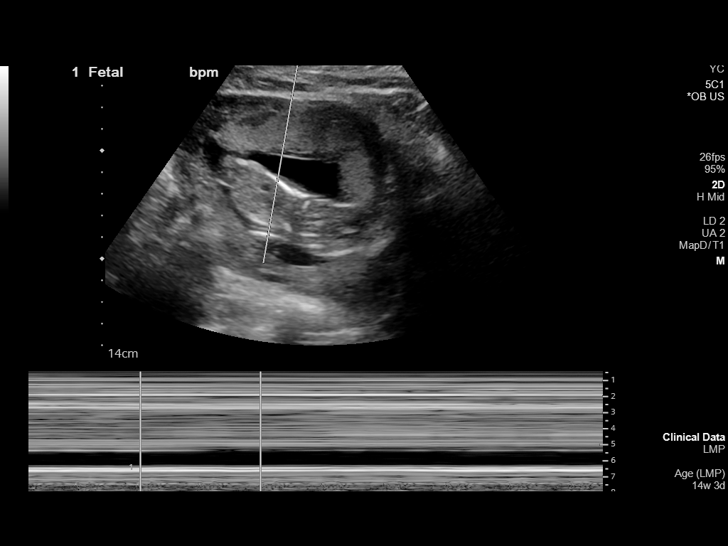
[im 22/39]
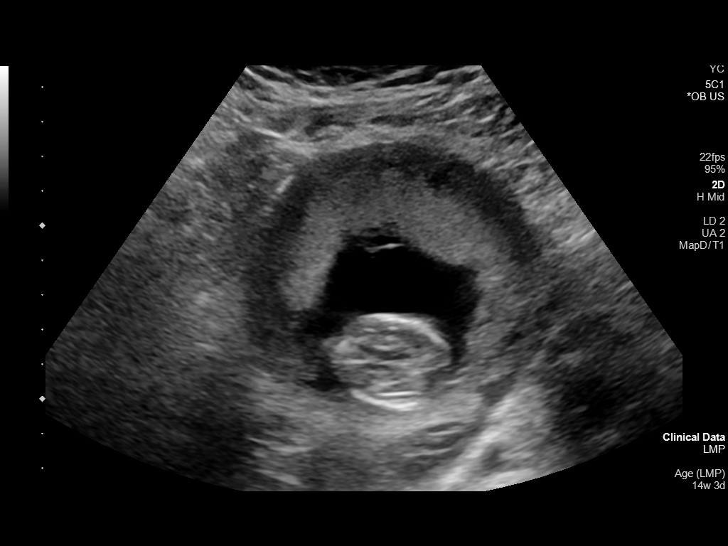
[im 24/39]
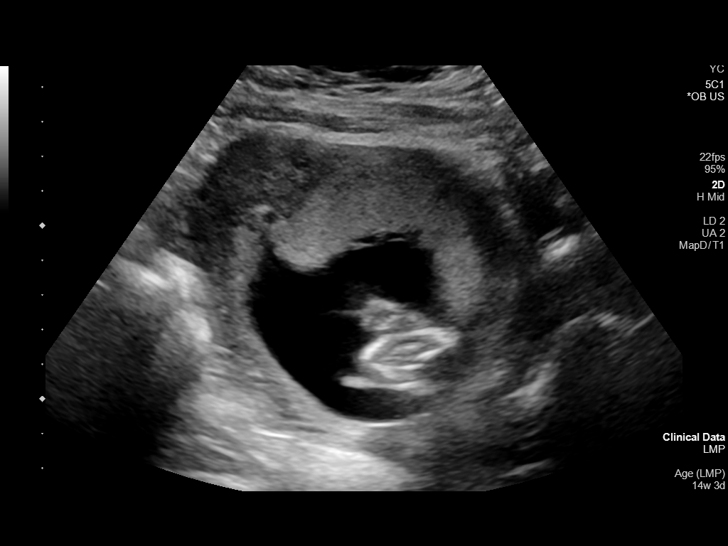
[im 27/39]
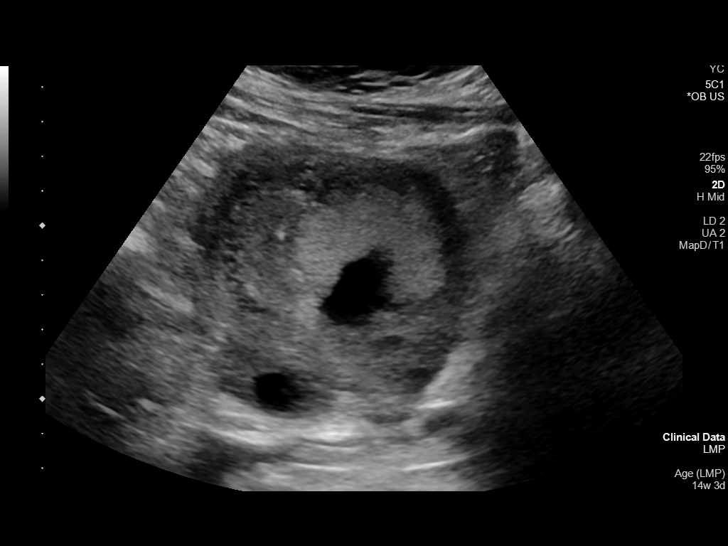
[im 30/39]
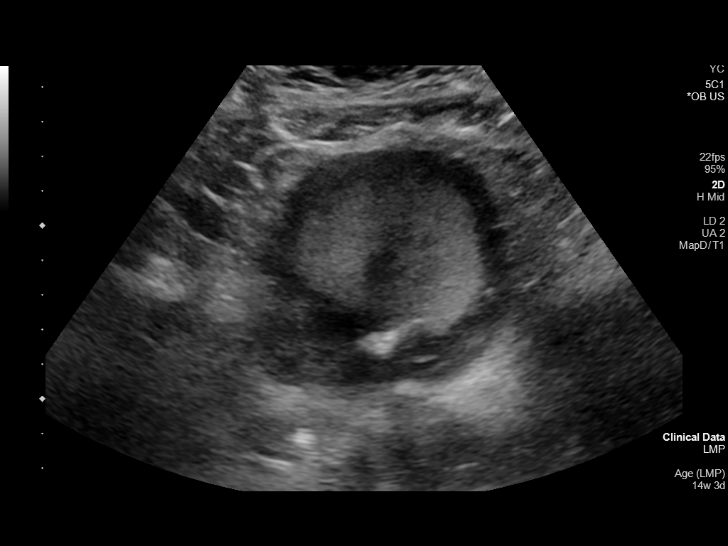
[im 33/39]
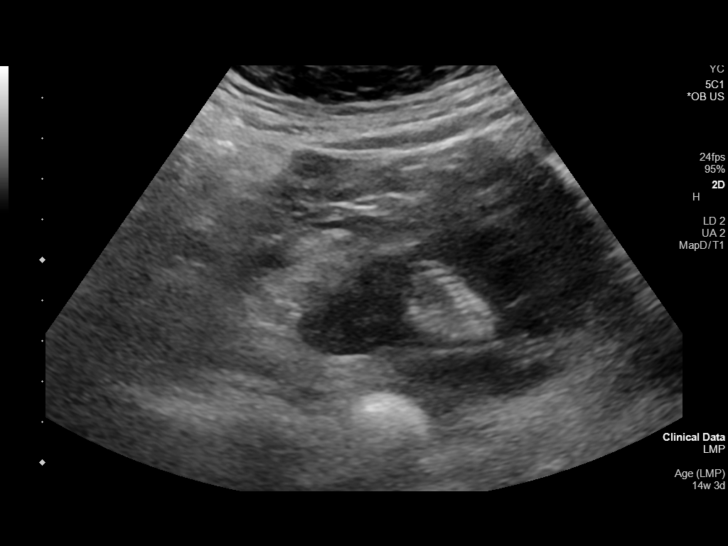
[im 36/39]
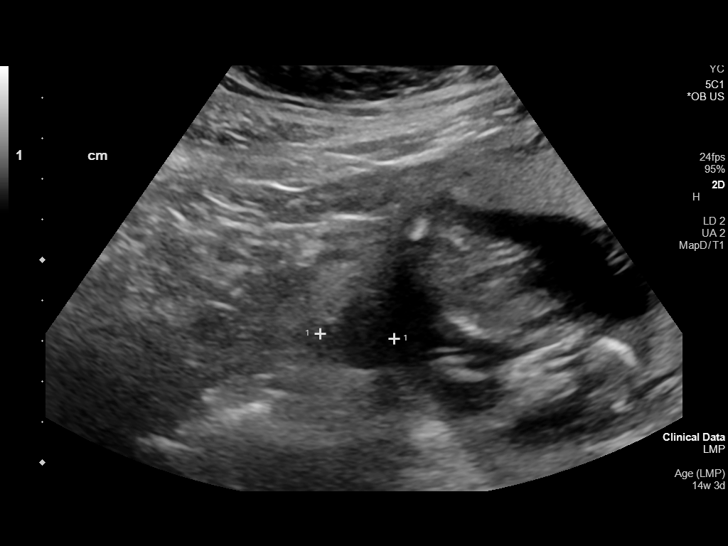
[im 39/39]
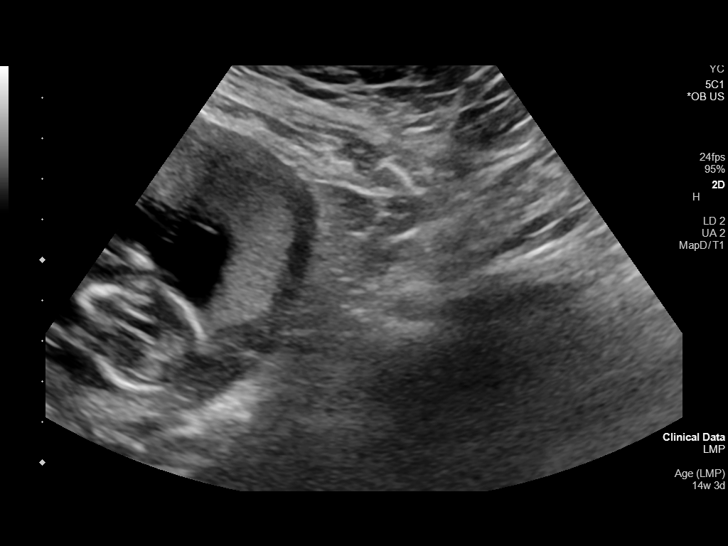

[14 of 28 positions shown; findings below may reference images not displayed]

FINDINGS: Number of Fetuses: 1

Heart Rate:  170 bpm

Movement: Visualized

Presentation: Transverse

Placental Location: Anterior

Previa: No

Amniotic Fluid (Subjective):  Within normal limits.

AFI:  cm

BPD: 2.6 cm 14 w  3 d

MATERNAL FINDINGS:

Cervix:  Appears closed.

Uterus/Adnexae: No abnormality visualized.
IMPRESSION: Fourteen week 3 day pregnancy. Fetal heart rate 170 beats per
minute. No acute maternal findings.

This exam is performed on an emergent basis and does not
comprehensively evaluate fetal size, dating, or anatomy; follow-up
complete OB US should be considered if further fetal assessment is
warranted.

## 2024-03-09 ENCOUNTER — Other Ambulatory Visit (HOSPITAL_COMMUNITY): Payer: Self-pay
